# Patient Record
Sex: Male | Born: 1970 | Race: White | Hispanic: No | Marital: Single | Smoking: Never smoker
Health system: Southern US, Community
[De-identification: ages and names within clinical notes are randomized; demographics above are authoritative.]

## PROBLEM LIST (undated history)

## (undated) DIAGNOSIS — R519 Headache, unspecified: Secondary | ICD-10-CM

## (undated) DIAGNOSIS — M12551 Traumatic arthropathy, right hip: Secondary | ICD-10-CM

## (undated) DIAGNOSIS — M1990 Unspecified osteoarthritis, unspecified site: Secondary | ICD-10-CM

## (undated) DIAGNOSIS — G43109 Migraine with aura, not intractable, without status migrainosus: Secondary | ICD-10-CM

## (undated) DIAGNOSIS — Z8489 Family history of other specified conditions: Secondary | ICD-10-CM

## (undated) DIAGNOSIS — I10 Essential (primary) hypertension: Secondary | ICD-10-CM

## (undated) DIAGNOSIS — M19012 Primary osteoarthritis, left shoulder: Secondary | ICD-10-CM

## (undated) DIAGNOSIS — G9389 Other specified disorders of brain: Secondary | ICD-10-CM

## (undated) DIAGNOSIS — E291 Testicular hypofunction: Secondary | ICD-10-CM

## (undated) DIAGNOSIS — I5189 Other ill-defined heart diseases: Secondary | ICD-10-CM

## (undated) DIAGNOSIS — G459 Transient cerebral ischemic attack, unspecified: Secondary | ICD-10-CM

## (undated) DIAGNOSIS — R9089 Other abnormal findings on diagnostic imaging of central nervous system: Secondary | ICD-10-CM

## (undated) DIAGNOSIS — K611 Rectal abscess: Secondary | ICD-10-CM

## (undated) DIAGNOSIS — G4730 Sleep apnea, unspecified: Secondary | ICD-10-CM

## (undated) DIAGNOSIS — R51 Headache: Secondary | ICD-10-CM

## (undated) HISTORY — DX: Traumatic arthropathy, right hip: M12.551

## (undated) HISTORY — DX: Primary osteoarthritis, left shoulder: M19.012

## (undated) HISTORY — DX: Other specified disorders of brain: G93.89

## (undated) HISTORY — PX: HIP SURGERY: SHX245

## (undated) HISTORY — DX: Essential (primary) hypertension: I10

## (undated) HISTORY — DX: Rectal abscess: K61.1

## (undated) HISTORY — DX: Testicular hypofunction: E29.1

## (undated) HISTORY — DX: Transient cerebral ischemic attack, unspecified: G45.9

## (undated) HISTORY — DX: Migraine with aura, not intractable, without status migrainosus: G43.109

## (undated) HISTORY — DX: Other abnormal findings on diagnostic imaging of central nervous system: R90.89

---

## 2010-12-28 ENCOUNTER — Emergency Department: Payer: Self-pay | Admitting: Emergency Medicine

## 2011-06-20 ENCOUNTER — Ambulatory Visit: Payer: Self-pay | Admitting: Orthopedic Surgery

## 2011-07-01 ENCOUNTER — Ambulatory Visit: Payer: Self-pay

## 2012-11-01 DIAGNOSIS — E291 Testicular hypofunction: Secondary | ICD-10-CM | POA: Insufficient documentation

## 2012-11-01 HISTORY — DX: Testicular hypofunction: E29.1

## 2012-12-05 DIAGNOSIS — M12551 Traumatic arthropathy, right hip: Secondary | ICD-10-CM | POA: Insufficient documentation

## 2012-12-05 HISTORY — DX: Traumatic arthropathy, right hip: M12.551

## 2013-11-04 DIAGNOSIS — M19012 Primary osteoarthritis, left shoulder: Secondary | ICD-10-CM

## 2013-11-04 HISTORY — DX: Primary osteoarthritis, left shoulder: M19.012

## 2015-01-12 ENCOUNTER — Encounter: Payer: Self-pay | Admitting: Emergency Medicine

## 2015-01-12 ENCOUNTER — Emergency Department: Payer: BC Managed Care – PPO

## 2015-01-12 ENCOUNTER — Observation Stay
Admit: 2015-01-12 | Discharge: 2015-01-12 | Disposition: A | Discharge status: Home / Self Care | Payer: BC Managed Care – PPO | Attending: Internal Medicine | Admitting: Internal Medicine

## 2015-01-12 ENCOUNTER — Observation Stay
Admission: EM | Admit: 2015-01-12 | Discharge: 2015-01-13 | Disposition: A | Discharge status: Home / Self Care | Payer: BC Managed Care – PPO | Attending: Internal Medicine | Admitting: Internal Medicine

## 2015-01-12 DIAGNOSIS — R112 Nausea with vomiting, unspecified: Secondary | ICD-10-CM | POA: Diagnosis not present

## 2015-01-12 DIAGNOSIS — G459 Transient cerebral ischemic attack, unspecified: Secondary | ICD-10-CM

## 2015-01-12 DIAGNOSIS — R930 Abnormal findings on diagnostic imaging of skull and head, not elsewhere classified: Secondary | ICD-10-CM | POA: Insufficient documentation

## 2015-01-12 DIAGNOSIS — R51 Headache: Secondary | ICD-10-CM | POA: Diagnosis present

## 2015-01-12 DIAGNOSIS — I638 Other cerebral infarction: Secondary | ICD-10-CM | POA: Diagnosis present

## 2015-01-12 DIAGNOSIS — M542 Cervicalgia: Secondary | ICD-10-CM | POA: Insufficient documentation

## 2015-01-12 DIAGNOSIS — R519 Headache, unspecified: Secondary | ICD-10-CM

## 2015-01-12 DIAGNOSIS — M1991 Primary osteoarthritis, unspecified site: Secondary | ICD-10-CM | POA: Diagnosis not present

## 2015-01-12 DIAGNOSIS — Z87820 Personal history of traumatic brain injury: Secondary | ICD-10-CM | POA: Diagnosis not present

## 2015-01-12 DIAGNOSIS — R42 Dizziness and giddiness: Secondary | ICD-10-CM | POA: Insufficient documentation

## 2015-01-12 DIAGNOSIS — I639 Cerebral infarction, unspecified: Secondary | ICD-10-CM

## 2015-01-12 DIAGNOSIS — I10 Essential (primary) hypertension: Secondary | ICD-10-CM

## 2015-01-12 DIAGNOSIS — G43909 Migraine, unspecified, not intractable, without status migrainosus: Principal | ICD-10-CM | POA: Insufficient documentation

## 2015-01-12 DIAGNOSIS — G8929 Other chronic pain: Secondary | ICD-10-CM

## 2015-01-12 DIAGNOSIS — G40209 Localization-related (focal) (partial) symptomatic epilepsy and epileptic syndromes with complex partial seizures, not intractable, without status epilepticus: Secondary | ICD-10-CM | POA: Insufficient documentation

## 2015-01-12 DIAGNOSIS — Z23 Encounter for immunization: Secondary | ICD-10-CM | POA: Insufficient documentation

## 2015-01-12 DIAGNOSIS — I6389 Other cerebral infarction: Secondary | ICD-10-CM

## 2015-01-12 HISTORY — DX: Transient cerebral ischemic attack, unspecified: G45.9

## 2015-01-12 HISTORY — DX: Unspecified osteoarthritis, unspecified site: M19.90

## 2015-01-12 HISTORY — DX: Essential (primary) hypertension: I10

## 2015-01-12 HISTORY — DX: Headache, unspecified: R51.9

## 2015-01-12 HISTORY — DX: Headache: R51

## 2015-01-12 LAB — CBC WITH DIFFERENTIAL/PLATELET
Basophils Absolute: 0 10*3/uL (ref 0–0.1)
Basophils Relative: 0 %
Eosinophils Absolute: 0 10*3/uL (ref 0–0.7)
Eosinophils Relative: 1 %
HEMATOCRIT: 49.1 % (ref 40.0–52.0)
HEMOGLOBIN: 17 g/dL (ref 13.0–18.0)
LYMPHS ABS: 0.9 10*3/uL — AB (ref 1.0–3.6)
LYMPHS PCT: 12 %
MCH: 33.3 pg (ref 26.0–34.0)
MCHC: 34.7 g/dL (ref 32.0–36.0)
MCV: 96.1 fL (ref 80.0–100.0)
MONOS PCT: 9 %
Monocytes Absolute: 0.6 10*3/uL (ref 0.2–1.0)
NEUTROS ABS: 5.4 10*3/uL (ref 1.4–6.5)
NEUTROS PCT: 78 %
Platelets: 218 10*3/uL (ref 150–440)
RBC: 5.11 MIL/uL (ref 4.40–5.90)
RDW: 12.8 % (ref 11.5–14.5)
WBC: 6.9 10*3/uL (ref 3.8–10.6)

## 2015-01-12 LAB — COMPREHENSIVE METABOLIC PANEL
ALBUMIN: 4.5 g/dL (ref 3.5–5.0)
ALK PHOS: 77 U/L (ref 38–126)
ALT: 18 U/L (ref 17–63)
ANION GAP: 7 (ref 5–15)
AST: 19 U/L (ref 15–41)
BILIRUBIN TOTAL: 0.6 mg/dL (ref 0.3–1.2)
BUN: 11 mg/dL (ref 6–20)
CALCIUM: 9.1 mg/dL (ref 8.9–10.3)
CO2: 27 mmol/L (ref 22–32)
CREATININE: 0.82 mg/dL (ref 0.61–1.24)
Chloride: 99 mmol/L — ABNORMAL LOW (ref 101–111)
GFR calc Af Amer: >60 mL/min (ref >60)
GFR calc non Af Amer: >60 mL/min (ref >60)
GLUCOSE: 98 mg/dL (ref 65–99)
Potassium: 3.9 mmol/L (ref 3.5–5.1)
Sodium: 133 mmol/L — ABNORMAL LOW (ref 135–145)
TOTAL PROTEIN: 7.2 g/dL (ref 6.5–8.1)

## 2015-01-12 LAB — URINALYSIS COMPLETE WITH MICROSCOPIC (ARMC ONLY)
BILIRUBIN URINE: NEGATIVE
Bacteria, UA: NONE SEEN
GLUCOSE, UA: NEGATIVE mg/dL
HGB URINE DIPSTICK: NEGATIVE
Ketones, ur: NEGATIVE mg/dL
LEUKOCYTES UA: NEGATIVE
Nitrite: NEGATIVE
Protein, ur: NEGATIVE mg/dL
SQUAMOUS EPITHELIAL / LPF: NONE SEEN
Specific Gravity, Urine: 1.015 (ref 1.005–1.030)
pH: 6 (ref 5.0–8.0)

## 2015-01-12 LAB — CBC
HCT: 47.8 % (ref 40.0–52.0)
Hemoglobin: 16.7 g/dL (ref 13.0–18.0)
MCH: 33.6 pg (ref 26.0–34.0)
MCHC: 34.8 g/dL (ref 32.0–36.0)
MCV: 96.3 fL (ref 80.0–100.0)
PLATELETS: 232 10*3/uL (ref 150–440)
RBC: 4.97 MIL/uL (ref 4.40–5.90)
RDW: 12.7 % (ref 11.5–14.5)
WBC: 6.8 10*3/uL (ref 3.8–10.6)

## 2015-01-12 LAB — CREATININE, SERUM
Creatinine, Ser: 0.79 mg/dL (ref 0.61–1.24)
GFR calc Af Amer: >60 mL/min (ref >60)
GFR calc non Af Amer: >60 mL/min (ref >60)

## 2015-01-12 LAB — LIPID PANEL
CHOL/HDL RATIO: 2.8 ratio
Cholesterol: 154 mg/dL (ref 0–200)
HDL: 55 mg/dL (ref >40)
LDL CALC: 79 mg/dL (ref 0–99)
Triglycerides: 98 mg/dL (ref <150)
VLDL: 20 mg/dL (ref 0–40)

## 2015-01-12 MED ORDER — HYDRALAZINE HCL 20 MG/ML IJ SOLN: 10.0000 mg | Freq: Four times a day (QID) | INTRAMUSCULAR | Status: DC | PRN

## 2015-01-12 MED ORDER — HEPARIN SODIUM (PORCINE) 5000 UNIT/ML IJ SOLN
5000.0000 [IU] | Freq: Three times a day (TID) | INTRAMUSCULAR | Status: DC
  Administered 2015-01-12 – 2015-01-13 (×2): 5000 [IU] via SUBCUTANEOUS
  Filled 2015-01-12 (×2): qty 1

## 2015-01-12 MED ORDER — BUTALBITAL-APAP-CAFFEINE 50-325-40 MG PO TABS: 1.0000 | ORAL_TABLET | Freq: Four times a day (QID) | ORAL | Status: DC | PRN

## 2015-01-12 MED ORDER — ASPIRIN 325 MG PO TABS
325.0000 mg | ORAL_TABLET | Freq: Every day | ORAL | Status: DC
  Administered 2015-01-12 – 2015-01-13 (×2): 325 mg via ORAL
  Filled 2015-01-12 (×2): qty 1

## 2015-01-12 MED ORDER — INFLUENZA VAC SPLIT QUAD 0.5 ML IM SUSY
0.5000 mL | PREFILLED_SYRINGE | INTRAMUSCULAR | Status: AC
  Administered 2015-01-13: 13:00:00 0.5 mL via INTRAMUSCULAR
  Filled 2015-01-12: qty 0.5

## 2015-01-12 MED ORDER — CELECOXIB 400 MG PO CAPS
400.0000 mg | ORAL_CAPSULE | Freq: Every day | ORAL | Status: DC
  Administered 2015-01-13: 10:00:00 400 mg via ORAL
  Filled 2015-01-12 (×3): qty 1

## 2015-01-12 MED ORDER — BUTALBITAL-APAP-CAFFEINE 50-325-40 MG PO TABS
1.0000 | ORAL_TABLET | ORAL | Status: DC | PRN
  Administered 2015-01-12: 1 via ORAL
  Filled 2015-01-12: qty 1

## 2015-01-12 MED ORDER — GADOBENATE DIMEGLUMINE 529 MG/ML IV SOLN
20.0000 mL | Freq: Once | INTRAVENOUS | Status: AC | PRN
  Administered 2015-01-12: 20 mL via INTRAVENOUS
  Filled 2015-01-12: qty 20

## 2015-01-12 MED ORDER — ATORVASTATIN CALCIUM 20 MG PO TABS
40.0000 mg | ORAL_TABLET | Freq: Every day | ORAL | Status: DC
  Administered 2015-01-12: 19:00:00 40 mg via ORAL
  Filled 2015-01-12: qty 2

## 2015-01-12 MED ORDER — SUMATRIPTAN SUCCINATE 6 MG/0.5ML ~~LOC~~ SOLN
6.0000 mg | Freq: Once | SUBCUTANEOUS | Status: AC
  Administered 2015-01-12: 6 mg via SUBCUTANEOUS
  Filled 2015-01-12: qty 0.5

## 2015-01-12 NOTE — Progress Notes (Signed)
*  PRELIMINARY RESULTS* Echocardiogram 2D Echocardiogram has been performed.  Christopher Chen 01/12/2015, 8:17 PM

## 2015-01-12 NOTE — ED Notes (Signed)
C/o ha past several days, with some visual problems

## 2015-01-12 NOTE — ED Provider Notes (Signed)
[facility] Emergency Department Provider Note  ____________________________________________  Time seen: Approximately 11:43 AM  I have reviewed the triage vital signs and the nursing notes.   HISTORY  Chief Complaint Headache   HPI Christopher Chen is a 44 y.o. male who presents for evaluation of severe headache 4 days sudden onset was Thursday of last week with blurred vision out of the right eye. Patient states that his still having decreased vision out of the right eye. Denies any numbness tingling or slurring of speech. Past medical history significant for headaches since 2001 but nothing as bad as the one 4 days ago. Vital signs noted with continued elevation of blood pressure.   Past Medical History  Diagnosis Date  . Generalized headaches   . Arthritis     Patient Active Problem List   Diagnosis Date Noted  . TIA (transient ischemic attack) 01/12/2015  . Hypertension 01/12/2015    History reviewed. No pertinent past surgical history.  Current Outpatient Rx  Name  Route  Sig  Dispense  Refill  . celecoxib (CELEBREX) 400 MG capsule   Oral   Take 400 mg by mouth daily.           Allergies Morphine and related  No family history on file.  Social History Social History  Substance Use Topics  . Smoking status: Never Smoker   . Smokeless tobacco: None  . Alcohol Use: No    Review of Systems Constitutional: No fever/chills Eyes: Positive visual changes to the right eye ENT: No sore throat. Cardiovascular: Denies chest pain. Respiratory: Denies shortness of breath. Gastrointestinal: No abdominal pain.  No nausea, no vomiting.  No diarrhea.  No constipation. Genitourinary: Negative for dysuria. Musculoskeletal: Negative for back pain. Skin: Negative for rash. Neurological: Positive for headaches, negative for focal weakness or numbness.  10-point ROS otherwise negative.  ____________________________________________   PHYSICAL  EXAM:  VITAL SIGNS: ED Triage Vitals  Enc Vitals Group     BP 01/12/15 1044 163/96 mmHg     Pulse Rate 01/12/15 1044 93     Resp 01/12/15 1044 18     Temp 01/12/15 1044 97.6 F (36.4 C)     Temp Source 01/12/15 1044 Oral     SpO2 01/12/15 1044 97 %     Weight 01/12/15 1044 235 lb (106.595 kg)     Height 01/12/15 1044 6\' 3"  (1.905 m)     Head Cir --      Peak Flow --      Pain Score 01/12/15 1045 8     Pain Loc --      Pain Edu? --      Excl. in [place]? --     Constitutional: Alert and oriented. Well appearing and in no acute distress. Eyes: Conjunctivae are normal. PERRL. EOMI. Head: Atraumatic. Nose: No congestion/rhinnorhea. Mouth/Throat: Mucous membranes are moist.  Oropharynx non-erythematous. Neck: No stridor.  No cervical spinal tenderness to palpation however paraspinal tenderness is palpable Cardiovascular: Normal rate, regular rhythm. Grossly normal heart sounds.  Good peripheral circulation. Respiratory: Normal respiratory effort.  No retractions. Lungs CTAB. Gastrointestinal: Soft and nontender. No distention. No abdominal bruits. No CVA tenderness. Musculoskeletal: No lower extremity tenderness nor edema.  No joint effusions. Neurologic:  Normal speech and language. No gross focal neurologic deficits are appreciated. No gait instability. Cranial nerves II through XII grossly intact. Skin:  Skin is warm, dry and intact. No rash noted. Psychiatric: Mood and affect are normal. Speech and behavior are normal.  ____________________________________________   LABS (all labs ordered are listed, but only abnormal results are displayed)  Labs Reviewed  COMPREHENSIVE METABOLIC PANEL - Abnormal; Notable for the following:    Sodium 133 (*)    Chloride 99 (*)    All other components within normal limits  URINALYSIS COMPLETEWITH MICROSCOPIC ([facility] ONLY) - Abnormal; Notable for the following:    Color, Urine YELLOW (*)    APPearance CLEAR (*)    All other components within  normal limits  CBC WITH DIFFERENTIAL/PLATELET - Abnormal; Notable for the following:    Lymphs Abs 0.9 (*)    All other components within normal limits  HEMOGLOBIN A1C  LIPID PANEL   ____________________________________________  EKG   ____________________________________________  RADIOLOGY  IMPRESSION: CT head: Small area of decreased attenuation at the gray -white junction of the anterior right parietal lobe, best seen on axial slice 19 series 2, concerning for small recent infarct. Gray-white compartments otherwise appear normal. No hemorrhage or mass effect.  CT cervical spine: No fracture or spondylolisthesis. No appreciable arthropathy. No disc extrusion or stenosis apparent. ____________________________________________   PROCEDURES  Procedure(s) performed: None  Critical Care performed: No  ____________________________________________   INITIAL IMPRESSION / ASSESSMENT AND PLAN / ED COURSE  Pertinent labs & imaging results that were available during my care of the patient were reviewed by me and considered in my medical decision making (see chart for details).  Discussed all clinical findings with the attending neurologist Dr. Tamala Julian. We'll admit through hospitalist with consultation. Diagnosis acute acute parietal infarct. ____________________________________________   FINAL CLINICAL IMPRESSION(S) / ED DIAGNOSES  Final diagnoses:  Chronic intractable headache, unspecified headache type  Parietal lobe infarction [facility])      Arlyss Repress, PA-C 01/12/15 [id number]  Lavonia Drafts, MD 01/13/15 1054

## 2015-01-12 NOTE — ED Notes (Signed)
Headache x 4 days, no head injury, history of similar headaches with blur vision also previous.

## 2015-01-12 NOTE — ED Notes (Signed)
Returned from mri 

## 2015-01-12 NOTE — ED Notes (Signed)
Several days ago has had several episodes of snowfl;ake flakes in visual fieldwould have a headache  2 days had worse headache with vomiting and diarrhe, Saturday only had 2 episodes of snowflakes in visual field view, today we woke up had blurrd vision in rioght eye, then then developed headache and nausea, , headache has been there all day today

## 2015-01-12 NOTE — ED Notes (Signed)
ADM MD WITH PT

## 2015-01-12 NOTE — ED Provider Notes (Addendum)
Patient with abrupt onset headache 4 days ago with subsequent decreased vision in the right eye. No other neuro deficits. CT concerning for CVA. EKG unremarkable, vitals shows mild hypertension. We will admit the patient for further workup  ED ECG REPORT I, Lavonia Drafts, the attending physician, personally viewed and interpreted this ECG.  Date: 01/12/2015 EKG Time: 1:55 PM Rate: 83 Rhythm: normal sinus rhythm QRS Axis: normal Intervals: normal ST/T Wave abnormalities: normal Conduction Disutrbances: none Narrative Interpretation: unremarkable    Lavonia Drafts, MD 01/12/15 1411  Lavonia Drafts, MD 01/12/15 [phone]

## 2015-01-12 NOTE — ED Notes (Signed)
Assessment per PA 

## 2015-01-12 NOTE — Progress Notes (Signed)
Headache for years  CT with subtle R parietal change  Probable mass due to no focal deficits but could be stroke or even epileptic -  EEG -  MRI of brain w/wo contrast -  PRN Fioricet -  Place on stroke protocol -  Full note to follow

## 2015-01-12 NOTE — ED Notes (Signed)
Patient transported to CT 

## 2015-01-12 NOTE — H&P (Signed)
[name]    MR#:  [id number]  DATE OF BIRTH:  [birth year]  DATE OF ADMISSION:  01/12/2015  PRIMARY CARE PHYSICIAN: Chen, Christopher Noa, MD   REQUESTING/REFERRING PHYSICIAN: Mcshane  CHIEF COMPLAINT:   Chief Complaint  Patient presents with  . Headache    HISTORY OF PRESENT ILLNESS: Christopher Chen  is a 43 y.o. male with a known history of arthritis following an accident, but otherwise very healthy and go for yearly physical check. Since Thursday evening which is 4 days ago every afternoon around 4 or 5:00 he have repeated symptoms of blurry vision in the right eye, severe headache on the right side, feeling dizziness. On Friday evening he also had excessive feeling of dizziness and vomiting. But the symptoms subside within few hours and he is able to carry over his function the next day so he waited for 4 days but as it kept coming back up finally today he came to emergency room. In ER initial CT scan of the head was done which showed possible stroke and so he is given his admission after doing neurology consult  PAST MEDICAL HISTORY:   Past Medical History  Diagnosis Date  . Generalized headaches   . Arthritis     PAST SURGICAL HISTORY: History reviewed. No pertinent past surgical history.  SOCIAL HISTORY:  Social History  Substance Use Topics  . Smoking status: Never Smoker   . Smokeless tobacco: Not on file  . Alcohol Use: No    FAMILY HISTORY: No family history on file.  DRUG ALLERGIES:  Allergies  Allergen Reactions  . Morphine And Related Itching    REVIEW OF SYSTEMS:   CONSTITUTIONAL: No fever, fatigue or weakness. Positive for headache. EYES: Positive for blurred or double vision.  EARS, NOSE, AND THROAT: No tinnitus or ear pain.  RESPIRATORY: No cough, shortness of breath, wheezing or hemoptysis.  CARDIOVASCULAR: No chest pain, orthopnea, edema.  GASTROINTESTINAL: No nausea,  vomiting, diarrhea or abdominal pain.  GENITOURINARY: No dysuria, hematuria.  ENDOCRINE: No polyuria, nocturia,  HEMATOLOGY: No anemia, easy bruising or bleeding SKIN: No rash or lesion. MUSCULOSKELETAL: No joint pain or arthritis.   NEUROLOGIC: No tingling, numbness, weakness.  PSYCHIATRY: No anxiety or depression.   MEDICATIONS AT HOME:  Prior to Admission medications   Medication Sig Start Date End Date Taking? Authorizing Provider  celecoxib (CELEBREX) 400 MG capsule Take 400 mg by mouth daily.   Yes Historical Provider, MD      PHYSICAL EXAMINATION:   VITAL SIGNS: Blood pressure 171/104, pulse 96, temperature 98.2 F (36.8 C), temperature source Oral, resp. rate 17, height 6\' 3"  (1.905 m), weight 106.595 kg (235 lb), SpO2 96 %.  GENERAL:  44 y.o.-year-old patient lying in the bed with no acute distress.  EYES: Pupils equal, round, reactive to light and accommodation. No scleral icterus. Extraocular muscles intact.  HEENT: Head atraumatic, normocephalic. Oropharynx and nasopharynx clear.  NECK:  Supple, no jugular venous distention. No thyroid enlargement, no tenderness.  LUNGS: Normal breath sounds bilaterally, no wheezing, rales,rhonchi or crepitation. No use of accessory muscles of respiration.  CARDIOVASCULAR: S1, S2 normal. No murmurs, rubs, or gallops.  ABDOMEN: Soft, nontender, nondistended. Bowel sounds present. No organomegaly or mass.  EXTREMITIES: No pedal edema, cyanosis, or clubbing.  NEUROLOGIC: Cranial nerves II through XII are intact. Muscle strength 5/5 in all extremities. Sensation intact. Gait not checked.  PSYCHIATRIC: The patient is alert and oriented  x 3.  SKIN: No obvious rash, lesion, or ulcer.   LABORATORY PANEL:   CBC  Recent Labs Lab 01/12/15 1326  WBC 6.9  HGB 17.0  HCT 49.1  PLT 218  MCV 96.1  MCH 33.3  MCHC 34.7  RDW 12.8  LYMPHSABS 0.9*  MONOABS 0.6  EOSABS 0.0  BASOSABS 0.0    ------------------------------------------------------------------------------------------------------------------  Chemistries   Recent Labs Lab 01/12/15 1326  NA 133*  K 3.9  CL 99*  CO2 27  GLUCOSE 98  BUN 11  CREATININE 0.82  CALCIUM 9.1  AST 19  ALT 18  ALKPHOS 77  BILITOT 0.6   ------------------------------------------------------------------------------------------------------------------ estimated creatinine clearance is 151.7 mL/min (by C-G formula based on Cr of 0.82). ------------------------------------------------------------------------------------------------------------------ No results for input(s): TSH, T4TOTAL, T3FREE, THYROIDAB in the last 72 hours.  Invalid input(s): FREET3   Coagulation profile No results for input(s): INR, PROTIME in the last 168 hours. ------------------------------------------------------------------------------------------------------------------- No results for input(s): DDIMER in the last 72 hours. -------------------------------------------------------------------------------------------------------------------  Cardiac Enzymes No results for input(s): CKMB, TROPONINI, MYOGLOBIN in the last 168 hours.  Invalid input(s): CK ------------------------------------------------------------------------------------------------------------------ Invalid input(s): POCBNP  ---------------------------------------------------------------------------------------------------------------  Urinalysis    Component Value Date/Time   COLORURINE YELLOW* 01/12/2015 1325   APPEARANCEUR CLEAR* 01/12/2015 1325   LABSPEC 1.015 01/12/2015 1325   PHURINE 6.0 01/12/2015 1325   GLUCOSEU NEGATIVE 01/12/2015 1325   HGBUR NEGATIVE 01/12/2015 1325   BILIRUBINUR NEGATIVE 01/12/2015 1325   KETONESUR NEGATIVE 01/12/2015 1325   PROTEINUR NEGATIVE 01/12/2015 1325   NITRITE NEGATIVE 01/12/2015 1325   LEUKOCYTESUR NEGATIVE 01/12/2015 1325      RADIOLOGY: Ct Head Wo Contrast  01/12/2015  CLINICAL DATA:  Four day history of headache and left-sided neck pain. Recent blurred vision EXAM: CT HEAD WITHOUT CONTRAST CT CERVICAL SPINE WITHOUT CONTRAST TECHNIQUE: Multidetector CT imaging of the head and cervical spine was performed following the standard protocol without intravenous contrast. Multiplanar CT image reconstructions of the cervical spine were also generated. COMPARISON:  None. FINDINGS: CT HEAD FINDINGS The ventricles are normal in size and configuration. The right lateral ventricle is slightly larger than the left lateral ventricle, an anatomic variant. There is no mass, hemorrhage, extra-axial fluid collection, or midline shift. There is a small area of decreased attenuation at the gray - white junction of the anterior right parietal lobe, concerning for a small infarct in this area. Elsewhere gray-white compartments appear normal. Bony calvarium appears intact. The mastoid air cells are clear. No intraorbital lesions are identified. CT CERVICAL SPINE FINDINGS There is no fracture or spondylolisthesis. Prevertebral soft tissues and predental space regions are normal. Disc spaces appear intact. There is no nerve root edema or effacement appreciable on this noncontrast enhanced study. No spinal stenosis evident. A small focus of calcification is noted in the nuchal ligament posterior to C6. IMPRESSION: CT head: Small area of decreased attenuation at the gray -white junction of the anterior right parietal lobe, best seen on axial slice 19 series 2, concerning for small recent infarct. Gray-white compartments otherwise appear normal. No hemorrhage or mass effect. CT cervical spine: No fracture or spondylolisthesis. No appreciable arthropathy. No disc extrusion or stenosis apparent. Electronically Signed   By: Lowella Grip III M.D.   On: 01/12/2015 12:39   Ct Cervical Spine Wo Contrast  01/12/2015  CLINICAL DATA:  Four day history of  headache and left-sided neck pain. Recent blurred vision EXAM: CT HEAD WITHOUT CONTRAST CT CERVICAL SPINE WITHOUT CONTRAST TECHNIQUE: Multidetector CT imaging of the head and cervical spine  was performed following the standard protocol without intravenous contrast. Multiplanar CT image reconstructions of the cervical spine were also generated. COMPARISON:  None. FINDINGS: CT HEAD FINDINGS The ventricles are normal in size and configuration. The right lateral ventricle is slightly larger than the left lateral ventricle, an anatomic variant. There is no mass, hemorrhage, extra-axial fluid collection, or midline shift. There is a small area of decreased attenuation at the gray - white junction of the anterior right parietal lobe, concerning for a small infarct in this area. Elsewhere gray-white compartments appear normal. Bony calvarium appears intact. The mastoid air cells are clear. No intraorbital lesions are identified. CT CERVICAL SPINE FINDINGS There is no fracture or spondylolisthesis. Prevertebral soft tissues and predental space regions are normal. Disc spaces appear intact. There is no nerve root edema or effacement appreciable on this noncontrast enhanced study. No spinal stenosis evident. A small focus of calcification is noted in the nuchal ligament posterior to C6. IMPRESSION: CT head: Small area of decreased attenuation at the gray -white junction of the anterior right parietal lobe, best seen on axial slice 19 series 2, concerning for small recent infarct. Gray-white compartments otherwise appear normal. No hemorrhage or mass effect. CT cervical spine: No fracture or spondylolisthesis. No appreciable arthropathy. No disc extrusion or stenosis apparent. Electronically Signed   By: Lowella Grip III M.D.   On: 01/12/2015 12:39   Mr Jeri Cos [id number] Contrast  01/12/2015  CLINICAL DATA:  Headache with blurred vision for 4 days. EXAM: MRI HEAD WITHOUT AND WITH CONTRAST TECHNIQUE: Multiplanar, multiecho  pulse sequences of the brain and surrounding structures were obtained without and with intravenous contrast. CONTRAST:  10mL MULTIHANCE GADOBENATE DIMEGLUMINE 529 MG/ML IV SOLN COMPARISON:  Head CT 01/12/2015 FINDINGS: There is no evidence of acute infarct, mass, midline shift, or extra-axial fluid collection. There is a small region of transcortical encephalomalacia in the right parietal lobe corresponding to the abnormality on CT. There is a small amount of associated chronic blood products or mineralization. The brain is otherwise normal in appearance. No abnormal enhancement is identified. There is no hydrocephalus. Orbits are unremarkable. Paranasal sinuses and mastoid air cells are clear. Major intracranial vascular flow voids are preserved. IMPRESSION: 1. No acute intracranial abnormality. 2. 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 Electronically Signed   By: Logan Bores M.D.   On: 01/12/2015 15:19    IMPRESSION AND PLAN:  * TIA  His Spiriva because of TIA or he might be having a comminuted migraine.  We'll do stroke workup MRI, echocardiogram, carotid Doppler study. Check lipid panel and give aspirin.  Seen by Neurologist.  We will give her Fioricet to help with migraine.   All the records are reviewed and case discussed with ED provider. Management plans discussed with the patient, family and they are in agreement.  CODE STATUS: Full code    TOTAL TIME TAKING CARE OF THIS PATIENT: 50 minutes.    Vaughan Basta M.D on 01/12/2015   Between 7am to 6pm - Pager - [phone]  After 6pm go to [email or web address] - password [email or web address] [facility]  Office  [phone]  CC: Primary care physician; [facility], Christopher Noa, MD   Note: This dictation was prepared with Dragon dictation along with smaller phrase technology. Any transcriptional errors that result from this process are unintentional.

## 2015-01-13 ENCOUNTER — Observation Stay: Payer: BC Managed Care – PPO

## 2015-01-13 LAB — CBC
HCT: 45.2 % (ref 40.0–52.0)
HEMOGLOBIN: 16 g/dL (ref 13.0–18.0)
MCH: 34.4 pg — AB (ref 26.0–34.0)
MCHC: 35.4 g/dL (ref 32.0–36.0)
MCV: 97 fL (ref 80.0–100.0)
PLATELETS: 205 10*3/uL (ref 150–440)
RBC: 4.65 MIL/uL (ref 4.40–5.90)
RDW: 13.3 % (ref 11.5–14.5)
WBC: 4.7 10*3/uL (ref 3.8–10.6)

## 2015-01-13 LAB — BASIC METABOLIC PANEL
ANION GAP: 5 (ref 5–15)
BUN: 13 mg/dL (ref 6–20)
CALCIUM: 8.5 mg/dL — AB (ref 8.9–10.3)
CO2: 28 mmol/L (ref 22–32)
CREATININE: 0.82 mg/dL (ref 0.61–1.24)
Chloride: 100 mmol/L — ABNORMAL LOW (ref 101–111)
GFR calc Af Amer: >60 mL/min (ref >60)
GLUCOSE: 95 mg/dL (ref 65–99)
Potassium: 3.7 mmol/L (ref 3.5–5.1)
Sodium: 133 mmol/L — ABNORMAL LOW (ref 135–145)

## 2015-01-13 LAB — HEMOGLOBIN A1C: HEMOGLOBIN A1C: 4.9 % (ref 4.0–6.0)

## 2015-01-13 MED ORDER — BUTALBITAL-APAP-CAFFEINE 50-325-40 MG PO TABS: 1.0000 | ORAL_TABLET | ORAL | Status: DC | PRN

## 2015-01-13 MED ORDER — ASPIRIN EC 81 MG PO TBEC: 81.0000 mg | DELAYED_RELEASE_TABLET | Freq: Every day | ORAL | Status: DC

## 2015-01-13 MED ORDER — OXCARBAZEPINE 300 MG PO TABS: 300.0000 mg | ORAL_TABLET | Freq: Two times a day (BID) | ORAL | Status: DC

## 2015-01-13 MED ORDER — OXCARBAZEPINE 150 MG PO TABS: 150.0000 mg | ORAL_TABLET | Freq: Two times a day (BID) | ORAL | Status: DC

## 2015-01-13 MED ORDER — OXCARBAZEPINE 150 MG PO TABS
150.0000 mg | ORAL_TABLET | Freq: Two times a day (BID) | ORAL | Status: DC
  Administered 2015-01-13: 14:00:00 150 mg via ORAL
  Filled 2015-01-13 (×2): qty 1

## 2015-01-13 NOTE — Plan of Care (Signed)
Problem: Education: Goal: Knowledge of [place] [facility] Education information/materials will improve Outcome: Progressing Pt likes to be called Christopher Chen    PAST MEDICAL HISTORY:    Past Medical History   Diagnosis  Date   .  Generalized headaches     .  Arthritis                 Problem: Coping: Goal: Ability to verbalize positive feelings about self will improve Outcome: Progressing Pt alert and oriented. No new neuro deficits noted during this shift. Up to the bathroom without complications. Continue to monitor

## 2015-01-13 NOTE — Discharge Instructions (Signed)
Diet-low salt Activity as tolerated, instructed not to drive until seen and evaluated by neurology in 8 weeks Follow-up with primary care physician in a week Follow-up with neurology in 8 weeks
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
## 2015-01-13 NOTE — Discharge Summary (Signed)
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 Generalized headaches   . 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 Melrose Nakayama in 6-8 weeks - Pt needs to keep an event log - Ok to d/c from neuro standpoint  DISCHARGE CONDITIONS:   fair  CONSULTS OBTAINED:      PROCEDURES none  DRUG ALLERGIES:   Allergies  Allergen Reactions  . Codeine Nausea And Vomiting  . Morphine And Related Itching    DISCHARGE MEDICATIONS:   Current Discharge Medication List    START taking these medications   Details  aspirin EC 81 MG tablet Take 1 tablet (81 mg total) by mouth daily. Qty: 30 tablet, Refills: 0    !! OXcarbazepine (TRILEPTAL) 150 MG tablet Take 1 tablet (150 mg total) by mouth 2 (two) times daily. Qty: 14 tablet, Refills: 0    !! Oxcarbazepine (TRILEPTAL) 300 MG tablet Take 1 tablet (300 mg total) by mouth 2 (two) times daily. Qty: 100 tablet, Refills: 0     !! - Potential duplicate medications found. Please discuss with provider.    CONTINUE these medications which have  NOT CHANGED   Details  celecoxib (CELEBREX) 400 MG capsule Take 400 mg by mouth daily.         DISCHARGE INSTRUCTIONS:   Diet-low salt Activity as tolerated, instructed not to drive until seen and evaluated by neurology in 8 weeks Follow-up with primary care physician in a week Follow-up with neurology in 8 weeks   DIET:  Low salt   DISCHARGE CONDITION:  fair  ACTIVITY:  As tolerated , instructed not to drive until seen by neurologist  OXYGEN:  Home Oxygen: no   Oxygen Delivery - none  DISCHARGE LOCATION:  Home    If you experience worsening of your admission symptoms, develop shortness of breath, life threatening emergency, suicidal or homicidal thoughts you must seek medical attention immediately by calling 911 or calling your MD immediately  if symptoms less severe.  You Must read complete instructions/literature along with all the possible adverse reactions/side effects for all the Medicines you take and that have been prescribed to you. Take any new Medicines after you have completely understood and accpet all the possible adverse reactions/side effects.   Please note  You were cared for by a hospitalist during your hospital stay. If you have any questions about your discharge medications or the care you received while you were in the hospital after you are discharged, you can call the unit and asked to speak with the hospitalist on call if the hospitalist that took care of  you is not available. Once you are discharged, your primary care physician will handle any further medical issues. Please note that NO REFILLS for any discharge medications will be authorized once you are discharged, as it is imperative that you return to your primary care physician (or establish a relationship with a primary care physician if you do not have one) for your aftercare needs so that they can reassess your need for medications and monitor your lab values.     Today  Chief Complaint   Patient presents with  . Headache   pts ha is resolved. No other compaints, no ON events per staff  ROS:  CONSTITUTIONAL: Denies fevers, chills. Denies any fatigue, weakness.  EYES: Denies blurry vision, double vision, eye pain. EARS, NOSE, THROAT: Denies tinnitus, ear pain, hearing loss. RESPIRATORY: Denies cough, wheeze, shortness of breath.  CARDIOVASCULAR: Denies chest pain, palpitations, edema.  GASTROINTESTINAL: Denies nausea, vomiting, diarrhea, abdominal pain. Denies bright red blood per rectum. GENITOURINARY: Denies dysuria, hematuria. ENDOCRINE: Denies nocturia or thyroid problems. HEMATOLOGIC AND LYMPHATIC: Denies easy bruising or bleeding. SKIN: Denies rash or lesion. MUSCULOSKELETAL: Denies pain in neck, back, shoulder, knees, hips or arthritic symptoms.  NEUROLOGIC: Denies paralysis, paresthesias.  PSYCHIATRIC: Denies anxiety or depressive symptoms.   VITAL SIGNS:  Blood pressure 127/78, pulse 79, temperature 98 F (36.7 C), temperature source Oral, resp. rate 16, height 6\' 3"  (1.905 m), weight 108.636 kg (239 lb 8 oz), SpO2 98 %.  I/O:   Intake/Output Summary (Last 24 hours) at 01/13/15 1558 Last data filed at 01/13/15 1200  Gross per 24 hour  Intake    480 ml  Output      0 ml  Net    480 ml    PHYSICAL EXAMINATION:  GENERAL:  44 y.o.-year-old patient lying in the bed with no acute distress.  EYES: Pupils equal, round, reactive to light and accommodation. No scleral icterus. Extraocular muscles intact.  HEENT: Head atraumatic, normocephalic. Oropharynx and nasopharynx clear.  NECK:  Supple, no jugular venous distention. No thyroid enlargement, no tenderness.  LUNGS: Normal breath sounds bilaterally, no wheezing, rales,rhonchi or crepitation. No use of accessory muscles of respiration.  CARDIOVASCULAR: S1, S2 normal. No murmurs, rubs, or gallops.  ABDOMEN: Soft, non-tender, non-distended. Bowel sounds present. No organomegaly or mass.  EXTREMITIES: No pedal  edema, cyanosis, or clubbing.  NEUROLOGIC: Cranial nerves II through XII are intact. Muscle strength 5/5 in all extremities. Sensation intact. Gait not checked.  PSYCHIATRIC: The patient is alert and oriented x 3.  SKIN: No obvious rash, lesion, or ulcer.   DATA REVIEW:   CBC  Recent Labs Lab 01/13/15 0605  WBC 4.7  HGB 16.0  HCT 45.2  PLT 205    Chemistries   Recent Labs Lab 01/12/15 1326  01/13/15 0605  NA 133*  --  133*  K 3.9  --  3.7  CL 99*  --  100*  CO2 27  --  28  GLUCOSE 98  --  95  BUN 11  --  13  CREATININE 0.82  < > 0.82  CALCIUM 9.1  --  8.5*  AST 19  --   --   ALT 18  --   --   ALKPHOS 77  --   --   BILITOT 0.6  --   --   < > = values in this interval not displayed.  Cardiac Enzymes No results for input(s): TROPONINI in the last 168 hours.  Microbiology Results  No results found  for this or any previous visit.  RADIOLOGY:  Ct Head Wo Contrast  01/12/2015  CLINICAL DATA:  Four day history of headache and left-sided neck pain. Recent blurred vision EXAM: CT HEAD WITHOUT CONTRAST CT CERVICAL SPINE WITHOUT CONTRAST TECHNIQUE: Multidetector CT imaging of the head and cervical spine was performed following the standard protocol without intravenous contrast. Multiplanar CT image reconstructions of the cervical spine were also generated. COMPARISON:  None. FINDINGS: CT HEAD FINDINGS The ventricles are normal in size and configuration. The right lateral ventricle is slightly larger than the left lateral ventricle, an anatomic variant. There is no mass, hemorrhage, extra-axial fluid collection, or midline shift. There is a small area of decreased attenuation at the gray - white junction of the anterior right parietal lobe, concerning for a small infarct in this area. Elsewhere gray-white compartments appear normal. Bony calvarium appears intact. The mastoid air cells are clear. No intraorbital lesions are identified. CT CERVICAL SPINE FINDINGS There is no fracture  or spondylolisthesis. Prevertebral soft tissues and predental space regions are normal. Disc spaces appear intact. There is no nerve root edema or effacement appreciable on this noncontrast enhanced study. No spinal stenosis evident. A small focus of calcification is noted in the nuchal ligament posterior to C6. IMPRESSION: CT head: Small area of decreased attenuation at the gray -white junction of the anterior right parietal lobe, best seen on axial slice 19 series 2, concerning for small recent infarct. Gray-white compartments otherwise appear normal. No hemorrhage or mass effect. CT cervical spine: No fracture or spondylolisthesis. No appreciable arthropathy. No disc extrusion or stenosis apparent. Electronically Signed   By: Lowella Grip III M.D.   On: 01/12/2015 12:39   Ct Cervical Spine Wo Contrast  01/12/2015  CLINICAL DATA:  Four day history of headache and left-sided neck pain. Recent blurred vision EXAM: CT HEAD WITHOUT CONTRAST CT CERVICAL SPINE WITHOUT CONTRAST TECHNIQUE: Multidetector CT imaging of the head and cervical spine was performed following the standard protocol without intravenous contrast. Multiplanar CT image reconstructions of the cervical spine were also generated. COMPARISON:  None. FINDINGS: CT HEAD FINDINGS The ventricles are normal in size and configuration. The right lateral ventricle is slightly larger than the left lateral ventricle, an anatomic variant. There is no mass, hemorrhage, extra-axial fluid collection, or midline shift. There is a small area of decreased attenuation at the gray - white junction of the anterior right parietal lobe, concerning for a small infarct in this area. Elsewhere gray-white compartments appear normal. Bony calvarium appears intact. The mastoid air cells are clear. No intraorbital lesions are identified. CT CERVICAL SPINE FINDINGS There is no fracture or spondylolisthesis. Prevertebral soft tissues and predental space regions are normal. Disc  spaces appear intact. There is no nerve root edema or effacement appreciable on this noncontrast enhanced study. No spinal stenosis evident. A small focus of calcification is noted in the nuchal ligament posterior to C6. IMPRESSION: CT head: Small area of decreased attenuation at the gray -white junction of the anterior right parietal lobe, best seen on axial slice 19 series 2, concerning for small recent infarct. Gray-white compartments otherwise appear normal. No hemorrhage or mass effect. CT cervical spine: No fracture or spondylolisthesis. No appreciable arthropathy. No disc extrusion or stenosis apparent. Electronically Signed   By: Lowella Grip III M.D.   On: 01/12/2015 12:39   Mr Jeri Cos [id number] Contrast  01/12/2015  CLINICAL DATA:  Headache with blurred vision for 4 days. EXAM: MRI HEAD WITHOUT AND WITH CONTRAST TECHNIQUE:  Multiplanar, multiecho pulse sequences of the brain and surrounding structures were obtained without and with intravenous contrast. CONTRAST:  80mL MULTIHANCE GADOBENATE DIMEGLUMINE 529 MG/ML IV SOLN COMPARISON:  Head CT 01/12/2015 FINDINGS: There is no evidence of acute infarct, mass, midline shift, or extra-axial fluid collection. There is a small region of transcortical encephalomalacia in the right parietal lobe corresponding to the abnormality on CT. There is a small amount of associated chronic blood products or mineralization. The brain is otherwise normal in appearance. No abnormal enhancement is identified. There is no hydrocephalus. Orbits are unremarkable. Paranasal sinuses and mastoid air cells are clear. Major intracranial vascular flow voids are preserved. IMPRESSION: 1. No acute intracranial abnormality. 2. 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 Electronically Signed   By: Logan Bores M.D.   On: 01/12/2015 15:19   US Carotid Bilateral  01/13/2015  CLINICAL DATA:  CVA. EXAM: BILATERAL CAROTID DUPLEX ULTRASOUND TECHNIQUE: Pearline Cables scale  imaging, color Doppler and duplex ultrasound were performed of bilateral carotid and vertebral arteries in the neck. COMPARISON:  MRI 01/12/2015. FINDINGS: Criteria: Quantification of carotid stenosis is based on velocity parameters that correlate the residual internal carotid diameter with NASCET-based stenosis levels, using the diameter of the distal internal carotid lumen as the denominator for stenosis measurement. The following velocity measurements were obtained: RIGHT ICA:  73/31 cm/sec CCA:  [id number] cm/sec SYSTOLIC ICA/CCA RATIO:  0.7 DIASTOLIC ICA/CCA RATIO:  1.5 ECA:  95 cm/sec LEFT ICA:  77/26 cm/sec CCA:  [id number] cm/sec SYSTOLIC ICA/CCA RATIO:  0.8 DIASTOLIC ICA/CCA RATIO:  1.3 ECA:  76 cm/sec RIGHT CAROTID ARTERY: No significant carotid atherosclerotic vascular disease. RIGHT VERTEBRAL ARTERY:  Patent with antegrade flow. LEFT CAROTID ARTERY: No significant carotid atherosclerotic vascular disease. LEFT VERTEBRAL ARTERY:  Patent with antegrade flow. IMPRESSION: 1. No significant carotid atherosclerotic vascular disease. 2. Vertebral arteries are patent with antegrade flow. Electronically Signed   By: Marcello Moores  Register   On: 01/13/2015 11:54    EKG:   Orders placed or performed during the hospital encounter of 01/12/15  . ED EKG  . ED EKG  . EKG 12-Lead  . EKG 12-Lead      Management plans discussed with the patient, family and they are in agreement.  CODE STATUS:     Code Status Orders        Start     Ordered   01/12/15 1828  Full code   Continuous     01/12/15 1827      TOTAL TIME TAKING CARE OF THIS PATIENT: 45 minutes.    @[facility] @  on 01/13/2015 at 3:58 PM  Between 7am to 6pm - Pager - [phone]  After 6pm go to [email or web address] - password [email or web address] [facility]  [facility]  Office  [phone]  CC: Primary care physician; [facility], Chrissie Noa, MD

## 2015-01-13 NOTE — Progress Notes (Signed)
[place], [place].   01/13/2015  Patient: Christopher Chen   Date of Birth:  1970/07/24  Date of admission:  01/12/2015  Date of Discharge  01/13/2015    To Whom it May Concern:   Steffanie Dunn  Should not drive until seen and  cleared by neurology in 6-8 weeks . Can return to work after seen and cleared by PCP in a week   PHYSICAL ACTIVITY:  None  If you have any questions or concerns, please don't hesitate to call.  Sincerely,   Nicholes Mango M.D Pager [phone] Office : [phone]   .

## 2015-02-12 DIAGNOSIS — G43109 Migraine with aura, not intractable, without status migrainosus: Secondary | ICD-10-CM | POA: Insufficient documentation

## 2015-02-12 DIAGNOSIS — R9089 Other abnormal findings on diagnostic imaging of central nervous system: Secondary | ICD-10-CM | POA: Insufficient documentation

## 2015-02-12 HISTORY — DX: Migraine with aura, not intractable, without status migrainosus: G43.109

## 2015-02-12 HISTORY — DX: Other abnormal findings on diagnostic imaging of central nervous system: R90.89

## 2015-03-03 DIAGNOSIS — G9389 Other specified disorders of brain: Secondary | ICD-10-CM

## 2015-03-03 HISTORY — DX: Other specified disorders of brain: G93.89

## 2015-05-13 ENCOUNTER — Emergency Department
Admission: EM | Admit: 2015-05-13 | Discharge: 2015-05-14 | Disposition: A | Discharge status: Home / Self Care | Payer: Worker's Compensation | Attending: Emergency Medicine | Admitting: Emergency Medicine

## 2015-05-13 DIAGNOSIS — Z7721 Contact with and (suspected) exposure to potentially hazardous body fluids: Secondary | ICD-10-CM | POA: Diagnosis not present

## 2015-05-13 DIAGNOSIS — Z23 Encounter for immunization: Secondary | ICD-10-CM | POA: Insufficient documentation

## 2015-05-13 DIAGNOSIS — I10 Essential (primary) hypertension: Secondary | ICD-10-CM | POA: Insufficient documentation

## 2015-05-13 DIAGNOSIS — Z7982 Long term (current) use of aspirin: Secondary | ICD-10-CM | POA: Insufficient documentation

## 2015-05-13 DIAGNOSIS — G459 Transient cerebral ischemic attack, unspecified: Secondary | ICD-10-CM | POA: Diagnosis not present

## 2015-05-13 NOTE — ED Notes (Signed)
Was exposed to blood while arresting someone and wants to be checked for possible exposure.

## 2015-05-14 MED ORDER — TETANUS-DIPHTH-ACELL PERTUSSIS 5-2.5-18.5 LF-MCG/0.5 IM SUSP
0.5000 mL | Freq: Once | INTRAMUSCULAR | Status: AC
  Administered 2015-05-14: 0.5 mL via INTRAMUSCULAR
  Filled 2015-05-14: qty 0.5

## 2015-05-14 NOTE — ED Provider Notes (Signed)
[facility] Emergency Department Provider Note  ____________________________________________  Time seen: Approximately 12:20 AM  I have reviewed the triage vital signs and the nursing notes.   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 Patient had a pre-existing small linear cut to his right third finger pad. He was exposed to blood while arresting a suspect. Unsure if blood actually got into his cut; vigorously washed his hands prior to arrival. Tetanus is not up-to-date, greater than 20 years. Patient voices no complaints. Denies striking head, LOC, headache, neck pain, chest pain, shortness of breath.   Past Medical History  Diagnosis Date  . Generalized headaches   . Arthritis     Patient Active Problem List   Diagnosis Date Noted  . TIA (transient ischemic attack) 01/12/2015  . Hypertension 01/12/2015    No past surgical history on file.  Current Outpatient Rx  Name  Route  Sig  Dispense  Refill  . aspirin EC 81 MG tablet   Oral   Take 1 tablet (81 mg total) by mouth daily.   30 tablet   0   . celecoxib (CELEBREX) 400 MG capsule   Oral   Take 400 mg by mouth daily.         Marland Kitchen EXPIRED: OXcarbazepine (TRILEPTAL) 150 MG tablet   Oral   Take 1 tablet (150 mg total) by mouth 2 (two) times daily.   14 tablet   0   . Oxcarbazepine (TRILEPTAL) 300 MG tablet   Oral   Take 1 tablet (300 mg total) by mouth 2 (two) times daily.   100 tablet   0     Start taking Trileptal 300 mg by mouth 2 times a d ...     Allergies Codeine and Morphine and related  No family history on file.  Social History Social History  Substance Use Topics  . Smoking status: Never Smoker   . Smokeless tobacco: Not on file  . Alcohol Use: No    Review of Systems  Constitutional: No fever/chills. Eyes: No visual  changes. ENT: No sore throat. Cardiovascular: Denies chest pain. Respiratory: Denies shortness of breath. Gastrointestinal: No abdominal pain.  No nausea, no vomiting.  No diarrhea.  No constipation. Genitourinary: Negative for dysuria. Musculoskeletal: Positive for blood exposure. Positive for small linear cut to right third finger pad. Negative for back pain. Skin: Negative for rash. Neurological: Negative for headaches, focal weakness or numbness.  10-point ROS otherwise negative.  ____________________________________________   PHYSICAL EXAM:  VITAL SIGNS: ED Triage Vitals  Enc Vitals Group     BP 05/13/15 2342 133/85 mmHg     Pulse Rate 05/13/15 2342 93     Resp 05/13/15 2342 18     Temp 05/13/15 2342 98.2 F (36.8 C)     Temp Source 05/13/15 2342 Oral     SpO2 05/13/15 2342 97 %     Weight 05/13/15 2342 240 lb (108.863 kg)     Height 05/13/15 2342 6\' 3"  (1.905 m)     Head Cir --      Peak Flow --      Pain Score --      Pain Loc --      Pain Edu? --      Excl. in [place]? --     Constitutional: Alert and oriented. Well appearing  and in no acute distress. Eyes: Conjunctivae are normal. PERRL. EOMI. Head: Atraumatic. Nose: No congestion/rhinnorhea. Mouth/Throat: Mucous membranes are moist.  Oropharynx non-erythematous. Neck: No stridor.   Cardiovascular: Normal rate, regular rhythm. Grossly normal heart sounds.  Good peripheral circulation. Respiratory: Normal respiratory effort.  No retractions. Lungs CTAB. Gastrointestinal: Soft and nontender. No distention. No abdominal bruits. No CVA tenderness. Musculoskeletal: Less then 0.5 cm linear cut to finger pad of right third digit which is well approximated and nonbleeding. No lower extremity tenderness nor edema.  No joint effusions. Neurologic:  Normal speech and language. No gross focal neurologic deficits are appreciated. No gait instability. Skin:  Skin is warm, dry and intact. No rash noted. Psychiatric: Mood and  affect are normal. Speech and behavior are normal.  ____________________________________________   LABS (all labs ordered are listed, but only abnormal results are displayed)  Labs Reviewed - No data to display ____________________________________________  EKG  None ____________________________________________  RADIOLOGY  None ____________________________________________   PROCEDURES  Procedure(s) performed: None  Critical Care performed: No  ____________________________________________   INITIAL IMPRESSION / ASSESSMENT AND PLAN / ED COURSE  Pertinent labs & imaging results that were available during my care of the patient were reviewed by me and considered in my medical decision making (see chart for details).  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 Suspects rapid HIV is negative. I have counseled patient against PEP. Will update tetanus shot and patient will follow-up with his occupational exposure clinic for follow-up. Strict return precautions given. Patient verbalizes understanding and agrees with plan of care. ____________________________________________   FINAL CLINICAL IMPRESSION(S) / ED DIAGNOSES  Final diagnoses:  Exposure to blood-borne pathogen      Paulette Blanch, MD 05/14/15 [phone]

## 2015-05-14 NOTE — Discharge Instructions (Signed)
Return to the ER for worsening symptoms, increased redness/swelling to finger, purulent discharge or other concerns.  Body Fluid Exposure Information People may come into contact with blood and other body fluids under various circumstances. In some cases, body fluids may contain germs (bacteria or viruses) that cause infections. These germs can be spread when another person's body fluids come into contact with your skin, mouth, eyes, or genitals.  Exposure to body fluids that may contain infectious material is a common problem for people providing care for others who are ill. It can occur when a person is performing health care tasks in the workplace or when taking care of a family member at home. Other common methods of exposure include injection drug use, sharing needles, and sexual activity. The risk of an infection spreading through body fluid exposure is small and depends on a variety of factors. This includes the type of body fluid, the nature of the exposure, and the health status of the person who was the source of the body fluids. Your health care provider can help you assess the risk. WHAT TYPES OF BODY FLUID CAN SPREAD INFECTION? The following types of body fluid have the potential to spread infections:  Blood.  Semen.  Vaginal secretions.  Urine.  Feces.  Saliva.  Nasal or eye discharge.  Breast milk.  Amniotic fluid and fluids surrounding body organs. WHAT ARE SOME FIRST-AID MEASURES FOR BODY FLUID EXPOSURE? The following steps should be taken as soon as possible after a person is exposed to body fluids: Intact Skin  For contact with closed skin, wash the area with soap and water. Broken Skin  For contact with broken skin (a wound), wash the area with soap and water. Let the area bleed a little. Then place a bandage or clean towel on the wound, applying gentle pressure to stop the bleeding. Do not squeeze or rub the area.  Use just water or hand sanitizer if a sink  with soap is not available.  Do not use harsh chemicals such as bleach or iodine. Eyes  Rinse the eyes with water or saline for 30 seconds.  If the person is wearing contact lenses, leave the contact lenses in while rinsing the eyes. Once the rinsing is complete, remove the contact lenses. Mouth  Spit out the fluids. Rinse and spit with water 4-5 times. In addition, you should remove any clothing that comes into contact with body fluids. However, if body fluid exposure results from sexual assault, seek medical care immediately without changing clothes or bathing. WHEN SHOULD YOU SEEK HELP? After performing the proper first-aid steps, you should contact your health care provider or seek emergency care right away if blood or other body fluids made contact with areas of broken skin or openings such as the eyes or mouth. If the exposure to body fluid happened in the workplace, you should report it to your work supervisor immediately. Many workplaces have procedures in place for exposure situations. WHAT WILL HAPPEN AFTER YOU REPORT THE EXPOSURE? Your health care provider will ask you several questions. Information requested may include:  Your medical history, including vaccination records.  Date and time of the exposure.  Whether you saw body fluids during the exposure.  Type of body fluid you were exposed to.  Volume of body fluid you were exposed to.  How the exposure happened.  If any devices, such as a needle, were being used.  Which area of your body made contact with the body fluid.  Description of  any injury to the skin or other area.  How long contact was made with the body fluid.  Any information you have about the health status of the person whose body fluid you were exposed to. The health care provider will assess your risk of infection. Often, no treatment is necessary. In some cases, the health care provider may recommend doing blood tests right away. Follow-up blood  tests may also be done at certain intervals during the upcoming weeks and months to check for changes. You may be offered treatment to prevent an infection from developing after exposure (post-exposure prophylaxis). This may include certain vaccinations or medicines and may be necessary when there is a risk of a serious infection, such as HIV or hepatitis B. Your health care provider should discuss appropriate treatment and vaccinations with you. HOW CAN YOU PREVENT EXPOSURE AND INFECTION? Always remember that prevention is the first line of defense against body fluid exposure. To help prevent exposure to body fluids:  Wash and disinfect countertops and other surfaces regularly.  Wear appropriate protective gear such as gloves, gowns, or eyewear when the possibility of exposure is present.  Wipe away spills of body fluid with disposable towels.  Properly dispose of blood products and other fluids. Use secured bags.  Properly dispose of needles and other instruments with sharp points or edges (sharps). Use closed, marked containers.  Avoid injection drug use.  Do not share needles.  Avoid recapping needles.  Use a condom during sexual intercourse.  Make sure you learn and follow any guidelines for preventing exposure (universal precautions) provided at your workplace. To help reduce your chances of getting an infection:  Make sure your vaccinations are up-to-date, including those for tetanus and hepatitis.  Wash your hands frequently with soap and water. Use hand sanitizers.  Avoid having multiple sex partners.  Follow up with your health care provider as directed after being evaluated for an exposure to body fluids. To avoid spreading infection to others:  Do not have sexual relations until you know you are free of infection.  Do not donate blood, plasma, breast milk, sperm, or other body fluids.  Do not share hygiene tools such as toothbrushes, razors, or dental floss.  Keep  open wounds covered.  Dispose of any items with blood on them (razors, tampons, bandages) by putting them in the trash.  Do not share drug supplies with others, such as needles, syringes, straws, or pipes.  Follow all of your health care provider's instructions for preventing the spread of infection.   This information is not intended to replace advice given to you by your health care provider. Make sure you discuss any questions you have with your health care provider.   Document Released: 09/26/2012 Document Revised: 01/29/2013 Document Reviewed: 09/26/2012 Elsevier Interactive Patient Education [employer].

## 2016-09-13 ENCOUNTER — Emergency Department: Payer: BC Managed Care – PPO

## 2016-09-13 ENCOUNTER — Emergency Department: Payer: BC Managed Care – PPO | Admitting: Anesthesiology

## 2016-09-13 ENCOUNTER — Encounter
Admission: EM | Disposition: A | Discharge status: Home / Self Care | Payer: Self-pay | Source: Home / Self Care | Attending: Emergency Medicine

## 2016-09-13 ENCOUNTER — Encounter: Payer: Self-pay | Admitting: Emergency Medicine

## 2016-09-13 ENCOUNTER — Observation Stay
Admission: EM | Admit: 2016-09-13 | Discharge: 2016-09-14 | Disposition: A | Discharge status: Home / Self Care | Payer: BC Managed Care – PPO | Attending: Surgery | Admitting: Surgery

## 2016-09-13 DIAGNOSIS — K611 Rectal abscess: Secondary | ICD-10-CM | POA: Diagnosis not present

## 2016-09-13 DIAGNOSIS — I10 Essential (primary) hypertension: Secondary | ICD-10-CM | POA: Insufficient documentation

## 2016-09-13 DIAGNOSIS — Z7982 Long term (current) use of aspirin: Secondary | ICD-10-CM | POA: Diagnosis not present

## 2016-09-13 DIAGNOSIS — K612 Anorectal abscess: Secondary | ICD-10-CM | POA: Diagnosis not present

## 2016-09-13 HISTORY — PX: INCISION AND DRAINAGE PERIRECTAL ABSCESS: SHX1804

## 2016-09-13 LAB — CBC WITH DIFFERENTIAL/PLATELET
BASOS ABS: 0.1 10*3/uL (ref 0–0.1)
Basophils Relative: 1 %
Eosinophils Absolute: 0.1 10*3/uL (ref 0–0.7)
Eosinophils Relative: 1 %
HEMATOCRIT: 42.4 % (ref 40.0–52.0)
Hemoglobin: 15.1 g/dL (ref 13.0–18.0)
LYMPHS PCT: 6 %
Lymphs Abs: 0.7 10*3/uL — ABNORMAL LOW (ref 1.0–3.6)
MCH: 34 pg (ref 26.0–34.0)
MCHC: 35.5 g/dL (ref 32.0–36.0)
MCV: 95.7 fL (ref 80.0–100.0)
Monocytes Absolute: 1 10*3/uL (ref 0.2–1.0)
Monocytes Relative: 9 %
NEUTROS ABS: 9 10*3/uL — AB (ref 1.4–6.5)
Neutrophils Relative %: 83 %
PLATELETS: 218 10*3/uL (ref 150–440)
RBC: 4.44 MIL/uL (ref 4.40–5.90)
RDW: 12.4 % (ref 11.5–14.5)
WBC: 10.8 10*3/uL — AB (ref 3.8–10.6)

## 2016-09-13 LAB — BASIC METABOLIC PANEL
ANION GAP: 8 (ref 5–15)
BUN: 21 mg/dL — ABNORMAL HIGH (ref 6–20)
CO2: 26 mmol/L (ref 22–32)
Calcium: 9 mg/dL (ref 8.9–10.3)
Chloride: 98 mmol/L — ABNORMAL LOW (ref 101–111)
Creatinine, Ser: 0.78 mg/dL (ref 0.61–1.24)
GFR calc Af Amer: >60 mL/min (ref >60)
Glucose, Bld: 94 mg/dL (ref 65–99)
POTASSIUM: 4.1 mmol/L (ref 3.5–5.1)
SODIUM: 132 mmol/L — AB (ref 135–145)

## 2016-09-13 SURGERY — INCISION AND DRAINAGE, ABSCESS, PERIRECTAL
Anesthesia: General | Site: Rectum | Wound class: Dirty or Infected

## 2016-09-13 MED ORDER — ASPIRIN EC 81 MG PO TBEC: 81.0000 mg | DELAYED_RELEASE_TABLET | Freq: Every day | ORAL | Status: DC

## 2016-09-13 MED ORDER — OXCARBAZEPINE 300 MG PO TABS
300.0000 mg | ORAL_TABLET | Freq: Two times a day (BID) | ORAL | Status: DC
  Filled 2016-09-13: qty 1

## 2016-09-13 MED ORDER — SODIUM CHLORIDE 0.9 % IV BOLUS (SEPSIS)
1000.0000 mL | Freq: Once | INTRAVENOUS | Status: AC
  Administered 2016-09-13: 1000 mL via INTRAVENOUS

## 2016-09-13 MED ORDER — ONDANSETRON HCL 4 MG/2ML IJ SOLN: 4.0000 mg | Freq: Four times a day (QID) | INTRAMUSCULAR | Status: DC | PRN

## 2016-09-13 MED ORDER — LACTATED RINGERS IV SOLN
INTRAVENOUS | Status: DC | PRN
  Administered 2016-09-13: 20:00:00 via INTRAVENOUS

## 2016-09-13 MED ORDER — DEXAMETHASONE SODIUM PHOSPHATE 10 MG/ML IJ SOLN
INTRAMUSCULAR | Status: DC | PRN
Start: 2016-09-13 — End: 2016-09-13
  Administered 2016-09-13: 10 mg via INTRAVENOUS

## 2016-09-13 MED ORDER — ONDANSETRON HCL 4 MG PO TABS: 4.0000 mg | ORAL_TABLET | Freq: Four times a day (QID) | ORAL | Status: DC | PRN

## 2016-09-13 MED ORDER — LIDOCAINE HCL (PF) 2 % IJ SOLN
INTRAMUSCULAR | Status: AC
  Filled 2016-09-13: qty 2

## 2016-09-13 MED ORDER — HYDROMORPHONE HCL 1 MG/ML IJ SOLN
0.5000 mg | Freq: Once | INTRAMUSCULAR | Status: AC
  Administered 2016-09-13: 0.5 mg via INTRAVENOUS
  Filled 2016-09-13: qty 1

## 2016-09-13 MED ORDER — HYDROMORPHONE HCL 1 MG/ML IJ SOLN: 0.5000 mg | INTRAMUSCULAR | Status: DC | PRN

## 2016-09-13 MED ORDER — PROPOFOL 10 MG/ML IV BOLUS
INTRAVENOUS | Status: AC
  Filled 2016-09-13: qty 20

## 2016-09-13 MED ORDER — FENTANYL CITRATE (PF) 100 MCG/2ML IJ SOLN
INTRAMUSCULAR | Status: AC
  Filled 2016-09-13: qty 2

## 2016-09-13 MED ORDER — PIPERACILLIN-TAZOBACTAM 3.375 G IVPB
3.3750 g | Freq: Three times a day (TID) | INTRAVENOUS | Status: DC
  Administered 2016-09-13 – 2016-09-14 (×2): 3.375 g via INTRAVENOUS
  Filled 2016-09-13 (×6): qty 50

## 2016-09-13 MED ORDER — PROPOFOL 10 MG/ML IV BOLUS
INTRAVENOUS | Status: DC | PRN
  Administered 2016-09-13: 200 mg via INTRAVENOUS

## 2016-09-13 MED ORDER — ONDANSETRON HCL 4 MG/2ML IJ SOLN
INTRAMUSCULAR | Status: DC | PRN
  Administered 2016-09-13: 4 mg via INTRAVENOUS

## 2016-09-13 MED ORDER — PIPERACILLIN-TAZOBACTAM 3.375 G IVPB 30 MIN
3.3750 g | Freq: Once | INTRAVENOUS | Status: AC
  Administered 2016-09-13: 3.375 g via INTRAVENOUS

## 2016-09-13 MED ORDER — FENTANYL CITRATE (PF) 100 MCG/2ML IJ SOLN
25.0000 ug | INTRAMUSCULAR | Status: DC | PRN
  Administered 2016-09-13 (×4): 25 ug via INTRAVENOUS

## 2016-09-13 MED ORDER — SUCCINYLCHOLINE CHLORIDE 20 MG/ML IJ SOLN
INTRAMUSCULAR | Status: DC | PRN
  Administered 2016-09-13: 120 mg via INTRAVENOUS

## 2016-09-13 MED ORDER — MIDAZOLAM HCL 2 MG/2ML IJ SOLN
INTRAMUSCULAR | Status: DC | PRN
  Administered 2016-09-13: 2 mg via INTRAVENOUS

## 2016-09-13 MED ORDER — LACTATED RINGERS IV SOLN
INTRAVENOUS | Status: DC
  Administered 2016-09-13 – 2016-09-14 (×2): via INTRAVENOUS

## 2016-09-13 MED ORDER — IOPAMIDOL (ISOVUE-300) INJECTION 61%
30.0000 mL | Freq: Once | INTRAVENOUS | Status: AC | PRN
  Administered 2016-09-13: 30 mL via ORAL

## 2016-09-13 MED ORDER — CELECOXIB 200 MG PO CAPS
400.0000 mg | ORAL_CAPSULE | Freq: Every day | ORAL | Status: DC
  Administered 2016-09-14: 400 mg via ORAL
  Filled 2016-09-13 (×2): qty 2

## 2016-09-13 MED ORDER — LIDOCAINE HCL (CARDIAC) 20 MG/ML IV SOLN
INTRAVENOUS | Status: DC | PRN
  Administered 2016-09-13: 50 mg via INTRAVENOUS

## 2016-09-13 MED ORDER — ONDANSETRON HCL 4 MG/2ML IJ SOLN
4.0000 mg | Freq: Once | INTRAMUSCULAR | Status: AC
  Administered 2016-09-13: 4 mg via INTRAVENOUS
  Filled 2016-09-13: qty 2

## 2016-09-13 MED ORDER — FENTANYL CITRATE (PF) 100 MCG/2ML IJ SOLN
INTRAMUSCULAR | Status: AC
  Administered 2016-09-13: 25 ug via INTRAVENOUS
  Filled 2016-09-13: qty 2

## 2016-09-13 MED ORDER — HYDROMORPHONE HCL 1 MG/ML IJ SOLN
0.5000 mg | Freq: Once | INTRAMUSCULAR | Status: AC
  Administered 2016-09-13: 0.5 mg via INTRAVENOUS

## 2016-09-13 MED ORDER — HEPARIN SODIUM (PORCINE) 5000 UNIT/ML IJ SOLN
5000.0000 [IU] | Freq: Three times a day (TID) | INTRAMUSCULAR | Status: DC
  Administered 2016-09-13 – 2016-09-14 (×2): 5000 [IU] via SUBCUTANEOUS
  Filled 2016-09-13 (×2): qty 1

## 2016-09-13 MED ORDER — IOPAMIDOL (ISOVUE-300) INJECTION 61%
100.0000 mL | Freq: Once | INTRAVENOUS | Status: AC | PRN
  Administered 2016-09-13: 100 mL via INTRAVENOUS

## 2016-09-13 MED ORDER — HYDROMORPHONE HCL 1 MG/ML IJ SOLN
INTRAMUSCULAR | Status: AC
  Filled 2016-09-13: qty 1

## 2016-09-13 MED ORDER — SUCCINYLCHOLINE CHLORIDE 20 MG/ML IJ SOLN
INTRAMUSCULAR | Status: AC
  Filled 2016-09-13: qty 1

## 2016-09-13 MED ORDER — SODIUM CHLORIDE 0.9 % IV BOLUS (SEPSIS): 1000.0000 mL | Freq: Once | INTRAVENOUS | Status: DC

## 2016-09-13 MED ORDER — FENTANYL CITRATE (PF) 100 MCG/2ML IJ SOLN
INTRAMUSCULAR | Status: DC | PRN
  Administered 2016-09-13 (×2): 50 ug via INTRAVENOUS

## 2016-09-13 MED ORDER — MORPHINE SULFATE (PF) 4 MG/ML IV SOLN: 2.0000 mg | INTRAVENOUS | Status: DC | PRN

## 2016-09-13 MED ORDER — PROMETHAZINE HCL 25 MG/ML IJ SOLN: 6.2500 mg | INTRAMUSCULAR | Status: DC | PRN

## 2016-09-13 MED ORDER — MIDAZOLAM HCL 2 MG/2ML IJ SOLN
INTRAMUSCULAR | Status: AC
  Filled 2016-09-13: qty 2

## 2016-09-13 MED ORDER — PHENYLEPHRINE HCL 10 MG/ML IJ SOLN
INTRAMUSCULAR | Status: AC
  Filled 2016-09-13: qty 1

## 2016-09-13 MED ORDER — TRAMADOL HCL 50 MG PO TABS
50.0000 mg | ORAL_TABLET | Freq: Four times a day (QID) | ORAL | Status: DC | PRN
  Administered 2016-09-13: 50 mg via ORAL
  Filled 2016-09-13: qty 1

## 2016-09-13 SURGICAL SUPPLY — 23 items
BLADE SURG 15 STRL LF DISP TIS (BLADE) ×1 IMPLANT
BLADE SURG 15 STRL SS (BLADE) ×2
BLADE SURG SZ11 CARB STEEL (BLADE) ×2 IMPLANT
BRIEF STRETCH MATERNITY 2XLG (MISCELLANEOUS) ×2 IMPLANT
CANISTER SUCT 1200ML W/VALVE (MISCELLANEOUS) ×2 IMPLANT
DRAIN PENROSE 1/4X12 LTX (DRAIN) ×2 IMPLANT
DRAPE LAPAROTOMY 100X77 ABD (DRAPES) ×2 IMPLANT
DRAPE LEGGINS SURG 28X43 STRL (DRAPES) ×2 IMPLANT
DRAPE UNDER BUTTOCK W/FLU (DRAPES) ×2 IMPLANT
GAUZE PACKING IODOFORM 1/2 (PACKING) ×2 IMPLANT
GAUZE SPONGE 4X4 12PLY STRL (GAUZE/BANDAGES/DRESSINGS) ×2 IMPLANT
GLOVE BIO SURGEON STRL SZ8 (GLOVE) ×4 IMPLANT
GOWN STRL REUS W/ TWL LRG LVL3 (GOWN DISPOSABLE) ×2 IMPLANT
GOWN STRL REUS W/TWL LRG LVL3 (GOWN DISPOSABLE) ×4
KIT RM TURNOVER STRD PROC AR (KITS) ×2 IMPLANT
LABEL OR SOLS (LABEL) ×2 IMPLANT
NS IRRIG 500ML POUR BTL (IV SOLUTION) ×2 IMPLANT
PACK BASIN MINOR ARMC (MISCELLANEOUS) ×2 IMPLANT
PAD ABD DERMACEA PRESS 5X9 (GAUZE/BANDAGES/DRESSINGS) ×4 IMPLANT
PREP PVP WINGED SPONGE (MISCELLANEOUS) ×2 IMPLANT
SUT ETH BLK MONO 3 0 FS 1 12/B (SUTURE) ×2 IMPLANT
SUT NYLON 2-0 (SUTURE) ×2 IMPLANT
SWAB CULTURE AMIES ANAERIB BLU (MISCELLANEOUS) ×2 IMPLANT

## 2016-09-13 NOTE — Anesthesia Procedure Notes (Signed)
Procedure Name: Intubation Date/Time: 09/13/2016 8:02 PM Performed by: Lendon Colonel Pre-anesthesia Checklist: Patient identified, Patient being monitored, Timeout performed, Emergency Drugs available and Suction available Patient Re-evaluated:Patient Re-evaluated prior to induction Oxygen Delivery Method: Circle system utilized Preoxygenation: Pre-oxygenation with 100% oxygen Induction Type: IV induction Ventilation: Mask ventilation without difficulty Laryngoscope Size: Miller and 2 Grade View: Grade II Tube type: Oral Tube size: 7.5 mm Number of attempts: 1 Airway Equipment and Method: Stylet Placement Confirmation: ETT inserted through vocal cords under direct vision,  positive ETCO2 and breath sounds checked- equal and bilateral Secured at: 22 cm Tube secured with: Tape Dental Injury: Teeth and Oropharynx as per pre-operative assessment

## 2016-09-13 NOTE — ED Provider Notes (Signed)
[facility] Emergency Department Provider Note ____________________________________________   I have reviewed the triage vital signs and the triage nursing note.  HISTORY  Chief Complaint Abscess   Historian Patient  HPI Christopher Chen is a 46 y.o. male presenting for rectal pain for about 3 days now. He does have a history of prior hemorrhoids, banding with Dr. Vira Agar in the past. States that pain started about 2-3 days ago have been worsening. Pain is worse these ever had before. He's now feeling generalized body aches and nausea/sweats. No fever.  Pain is severe and located at the left buttock/left rectum.    Past Medical History:  Diagnosis Date  . Arthritis   . Generalized headaches     Patient Active Problem List   Diagnosis Date Noted  . TIA (transient ischemic attack) 01/12/2015  . Hypertension 01/12/2015    History reviewed. No pertinent surgical history.  Prior to Admission medications   Medication Sig Start Date End Date Taking? Authorizing Provider  aspirin EC 81 MG tablet Take 1 tablet (81 mg total) by mouth daily. 01/13/15   Nicholes Mango, MD  celecoxib (CELEBREX) 400 MG capsule Take 400 mg by mouth daily.    [provider]  OXcarbazepine (TRILEPTAL) 150 MG tablet Take 1 tablet (150 mg total) by mouth 2 (two) times daily. 01/13/15 01/19/15  Nicholes Mango, MD  Oxcarbazepine (TRILEPTAL) 300 MG tablet Take 1 tablet (300 mg total) by mouth 2 (two) times daily. 01/20/15   Nicholes Mango, MD    Allergies  Allergen Reactions  . Codeine Nausea And Vomiting  . Morphine And Related Itching    No family history on file.  Social History Social History  Substance Use Topics  . Smoking status: Never Smoker  . Smokeless tobacco: Never Used  . Alcohol use No    Review of Systems  Constitutional: Negative for fever. Eyes: Negative for visual changes. ENT: Negative for sore throat. Cardiovascular: Negative for chest  pain. Respiratory: Negative for shortness of breath. Gastrointestinal: Negative for abdominal pain, vomiting and diarrhea. Genitourinary: Negative for dysuria. Musculoskeletal: Negative for back pain. Skin: Negative for rash. Neurological: Negative for headache.  ____________________________________________   PHYSICAL EXAM:  VITAL SIGNS: ED Triage Vitals  Enc Vitals Group     BP 09/13/16 1231 (!) 142/91     Pulse Rate 09/13/16 1231 94     Resp 09/13/16 1231 16     Temp 09/13/16 1231 98.5 F (36.9 C)     Temp Source 09/13/16 1231 Oral     SpO2 09/13/16 1231 96 %     Weight 09/13/16 1233 230 lb (104.3 kg)     Height 09/13/16 1233 6\' 2"  (1.88 m)     Head Circumference --      Peak Flow --      Pain Score 09/13/16 1229 6     Pain Loc --      Pain Edu? --      Excl. in [place]? --      Constitutional: Alert and oriented. Well appearing and in no distress. HEENT   Head: Normocephalic and atraumatic.      Eyes: Conjunctivae are normal. Pupils equal and round.       Ears:         Nose: No congestion/rhinnorhea.   Mouth/Throat: Mucous membranes are moist.   Neck: No stridor. Cardiovascular/Chest: Normal rate, regular rhythm.  No murmurs, rubs, or gallops. Respiratory: Normal respiratory effort without tachypnea nor retractions. Breath sounds are clear and  equal bilaterally. No wheezes/rales/rhonchi. Gastrointestinal: Soft. No distention, no guarding, no rebound. Nontender.   Genitourinary/rectal: Tender swollen mass involving the rectum at the posterior left side of the rectum.  No obvious external hemorrhoid, however some purplish discoloration. No surrounding cellulitis. Very especially tender to palpation. Musculoskeletal: Nontender with normal range of motion in all extremities. No joint effusions.  No lower extremity tenderness.  No edema. Neurologic:  Normal speech and language. No gross or focal neurologic deficits are appreciated. Skin:  Skin is warm, dry and  intact. No rash noted. Psychiatric: Mood and affect are normal. Speech and behavior are normal. Patient exhibits appropriate insight and judgment.   ____________________________________________  LABS (pertinent positives/negatives)  Labs Reviewed  BASIC METABOLIC PANEL - Abnormal; Notable for the following:       Result Value   Sodium 132 (*)    Chloride 98 (*)    BUN 21 (*)    All other components within normal limits  CBC WITH DIFFERENTIAL/PLATELET - Abnormal; Notable for the following:    WBC 10.8 (*)    Neutro Abs 9.0 (*)    Lymphs Abs 0.7 (*)    All other components within normal limits  CULTURE, BLOOD (ROUTINE X 2)  CULTURE, BLOOD (ROUTINE X 2)    ____________________________________________    EKG I, Lisa Roca, MD, the attending physician have personally viewed and interpreted all ECGs.  None ____________________________________________  RADIOLOGY All Xrays were viewed by me. Imaging interpreted by Radiologist.  CT pelvis with contrast:  IMPRESSION: Probable developing perirectal abscess extending into the medial LEFT buttock, not yet entirely well defined but overall area measuring approximately 5.7 x 2.4 x 2.9 cm with additional mild surrounding infiltrative changes. __________________________________________  PROCEDURES  Procedure(s) performed: None  Critical Care performed: None  ____________________________________________   ED COURSE / ASSESSMENT AND PLAN  Pertinent labs & imaging results that were available during my care of the patient were reviewed by me and considered in my medical decision making (see chart for details).   Mr. Sokolowski has a exquisitely tender mass involving the rectum, not very much cellulitis or fluctuance, however I still suspect abscess more so then Cardilate hemorrhoid.  White blood count is somewhat elevated. A medicine for culture as well. I will CT her pelvis for better evaluation.  CT shows consistent with left  perirectal abscess. I did consult Dr. Dahlia Byes, through surgical/R nurse. Patient will consult in the ED.    CONSULTATIONS:  Dr. Dahlia Byes, general surgery.   Patient / Family / Caregiver informed of clinical course, medical decision-making process, and agree with plan.   ___________________________________________   FINAL CLINICAL IMPRESSION(S) / ED DIAGNOSES   Final diagnoses:  Perirectal abscess              Note: This dictation was prepared with Dragon dictation. Any transcriptional errors that result from this process are unintentional    Lisa Roca, MD 09/13/16 1623

## 2016-09-13 NOTE — ED Notes (Signed)
Pt to ed with c/o ?abscess to rectal area x 2 days.  Area on left side of rectum, red, swollen, tender to touch.

## 2016-09-13 NOTE — ED Triage Notes (Signed)
C/O abscess to buttocks x 1 day.

## 2016-09-13 NOTE — H&P (Signed)
Patient ID: Christopher Chen, male   DOB: 1970-08-16, 46 y.o.   MRN: [id number]  HPI Daryle Amis is a 46 y.o. male comes with a 2 day history of severe anorectal pain. Patient reports the pain is worse when he sits And puts pressure on her buttocks. Never had this is Christine pain like this before. Pain is sharp and no specific aggravating factors. No fevers but he reports feeling some malaise and questionable chills. Workup included a CT scan that I have personally reviewed there is evidence of significant induration on the left perianal area. White count is 10.8, normal creatinine and increase in BUN.   HPI  Past Medical History:  Diagnosis Date  . Arthritis   . Generalized headaches     History reviewed. No pertinent surgical history.  No family history on file.  Social History Social History  Substance Use Topics  . Smoking status: Never Smoker  . Smokeless tobacco: Never Used  . Alcohol use No    Allergies  Allergen Reactions  . Codeine Nausea And Vomiting  . Morphine And Related Itching    No current facility-administered medications for this encounter.    Current Outpatient Prescriptions  Medication Sig Dispense Refill  . aspirin EC 81 MG tablet Take 1 tablet (81 mg total) by mouth daily. 30 tablet 0  . celecoxib (CELEBREX) 400 MG capsule Take 400 mg by mouth daily.    . OXcarbazepine (TRILEPTAL) 150 MG tablet Take 1 tablet (150 mg total) by mouth 2 (two) times daily. 14 tablet 0  . Oxcarbazepine (TRILEPTAL) 300 MG tablet Take 1 tablet (300 mg total) by mouth 2 (two) times daily. 100 tablet 0     Review of Systems Full ROS  was asked and was negative except for the information on the HPI  Physical Exam Blood pressure 125/79, pulse 78, temperature 98.5 F (36.9 C), temperature source Oral, resp. rate 16, height 6\' 2"  (1.88 m), weight 104.3 kg (230 lb), SpO2 93 %. CONSTITUTIONAL: NAD, anxious EYES: Pupils are equal, round, and reactive to light, Sclera are  non-icteric. EARS, NOSE, MOUTH AND THROAT: The oropharynx is clear. The oral mucosa is pink and moist. Hearing is intact to voice. LYMPH NODES:  Lymph nodes in the neck are normal. RESPIRATORY:  Lungs are clear. There is normal respiratory effort, with equal breath sounds bilaterally, and without pathologic use of accessory muscles. CARDIOVASCULAR: Heart is regular without murmurs, gallops, or rubs. GI: The abdomen is  soft, nontender, and nondistended. There are no palpable masses. There is no hepatosplenomegaly. There are normal bowel sounds in all quadrants. GU: There is exquisite tenderness to palpation and fluctuance located to the left on the posterior midline. No evidence of necrotizing infection  MUSCULOSKELETAL: Normal muscle strength and tone. No cyanosis or edema.   SKIN: Turgor is good and there are no pathologic skin lesions or ulcers. NEUROLOGIC: Motor and sensation is grossly normal. Cranial nerves are grossly intact. PSYCH:  Oriented to person, place and time. Affect is normal.  Data Reviewed  I have personally reviewed the patient's imaging, laboratory findings and medical records.    Assessment/ Plan  46 year old presented with classic signs and symptoms consistent with perianal abscess. Discussed with the patient and family in detail about his disease process. I do recommend proceeding to the operating room for an exam under anesthesia and drainage of the abscesses. Discussed with them in detail about the procedure, the risk, benefits and possible complications including but not limited to: Recurrence, fistula formation,  injury to adjacent structures and chronic pain. They understand and they wish to proceed. He was very anxious and but I answered all of his questions in detail. I also discussed with him that Dr. Burt Knack will be the operating surgeon since there is an emergency case going on right now in the OR. We'll perform the I&D in the OR tonight In the meantime we'll  start him on broad-spectrum antibiotics and give him a crystalloid bolus.  Caroleen Hamman, MD FACS General Surgeon 09/13/2016, 6:21 PM

## 2016-09-13 NOTE — ED Notes (Signed)
Pt taken to CT via stretcher, pt asking for more pain medication. Dr. Reita Cliche updated.

## 2016-09-13 NOTE — Transfer of Care (Signed)
Immediate Anesthesia Transfer of Care Note  Patient: Christopher Chen  Procedure(s) Performed: Procedure(s): IRRIGATION AND DEBRIDEMENT PERIRECTAL ABSCESS (N/A)  Patient Location: PACU  Anesthesia Type:General  Level of Consciousness: awake, alert , oriented and patient cooperative  Airway & Oxygen Therapy: Patient Spontanous Breathing  Post-op Assessment: Report given to RN and Post -op Vital signs reviewed and stable  Post vital signs: Reviewed and stable  Last Vitals:  Vitals:   09/13/16 1700 09/13/16 1730  BP: 125/79 130/89  Pulse: 78   Resp:    Temp:      Last Pain:  Vitals:   09/13/16 1703  TempSrc:   PainSc: 4          Complications: No apparent anesthesia complications

## 2016-09-13 NOTE — Discharge Instructions (Signed)
Calls surgical clinic today and told to follow-up from the emergency room.

## 2016-09-13 NOTE — Op Note (Signed)
09/13/2016  8:35 PM  PATIENT:  Christopher Chen  46 y.o. male  PRE-OPERATIVE DIAGNOSIS:  Perirectal abscess  POST-OPERATIVE DIAGNOSIS:  Perirectal abscess  PROCEDURE: Incision and drainage of perirectal abscess  SURGEON:  Florene Glen MD, FACS  ANESTHESIA:   Gen. with LMA   Details of Procedure: This patient with a perirectal abscess. He is here for elective or semiurgent examination under anesthesia and incision and drainage of a perirectal abscess. Preoperatively we discussed the options of observation and the risk of bleeding infection recurrence of fistula occurrence. He understood and agreed to proceed.  Patient was induced to general anesthesia and draped sterile fashion is placed in high lithotomy position. A surgical pause was performed identifying the patient properly. Examination under anesthesia demonstrated a hard erythematous mass in the left buttock with fluctuance. An 11 blade was placed into the area of fluctuance and purulence exuded. This was cultured. A Yankauer sucker was placed into the cavity and loculations were broken up. The area was irrigated with copious metastases normal saline and then a small Penrose drain was placed into the cavity and held in with 3-0 nylon. A sterile dressing was placed.  Patient was taken out of lithotomy position burn panties were placed with a dressing. And he was taken to recovery room in stable condition to be admitted for continued care as Mabel loss was nil and sponge lap needle count was correct.   Florene Glen, MD FACS

## 2016-09-13 NOTE — Progress Notes (Signed)
Preoperative Review   Patient is met in the preoperative holding area. The history is reviewed in the chart and with the patient. I personally reviewed the options and rationale as well as the risks of this procedure that have been previously discussed with the patient. Dr. Perrin Maltese discussed all of this in detail and I reviewed for the patient the options as well as the risks of bleeding infection recurrence and fistula formation. Patient had multiple questions again which were answered to the best of my ability. He understood and agreed with this plan. All questions asked by the patient and/or family were answered to their satisfaction.  Patient agrees to proceed with this procedure at this time.  Florene Glen M.D. FACS

## 2016-09-13 NOTE — Anesthesia Post-op Follow-up Note (Signed)
Anesthesia QCDR form completed.        

## 2016-09-13 NOTE — Anesthesia Preprocedure Evaluation (Signed)
Anesthesia Evaluation  Patient identified by MRN, date of birth, ID band Patient awake    Reviewed: Allergy & Precautions, H&P , NPO status , Patient's Chart, lab work & pertinent test results, reviewed documented beta blocker date and time   History of Anesthesia Complications Negative for: history of anesthetic complications  Airway Mallampati: I  TM Distance: >3 FB Neck ROM: full    Dental no notable dental hx. (+) Dental Advidsory Given, Teeth Intact   Pulmonary neg pulmonary ROS,           Cardiovascular Exercise Tolerance: Good negative cardio ROS       Neuro/Psych  Headaches, negative psych ROS   GI/Hepatic negative GI ROS, Neg liver ROS,   Endo/Other  negative endocrine ROS  Renal/GU negative Renal ROS  negative genitourinary   Musculoskeletal   Abdominal   Peds  Hematology negative hematology ROS (+)   Anesthesia Other Findings Past Medical History: No date: Arthritis No date: Generalized headaches   Reproductive/Obstetrics negative OB ROS                             Anesthesia Physical Anesthesia Plan  ASA: I  Anesthesia Plan: General   Post-op Pain Management:    Induction: Intravenous  PONV Risk Score and Plan: 2 and Ondansetron and Dexamethasone  Airway Management Planned: Oral ETT  Additional Equipment:   Intra-op Plan:   Post-operative Plan: Extubation in OR  Informed Consent: I have reviewed the patients History and Physical, chart, labs and discussed the procedure including the risks, benefits and alternatives for the proposed anesthesia with the patient or authorized representative who has indicated his/her understanding and acceptance.   Dental Advisory Given  Plan Discussed with: Anesthesiologist, CRNA and Surgeon  Anesthesia Plan Comments:         Anesthesia Quick Evaluation

## 2016-09-13 NOTE — ED Notes (Signed)
CT notified pt is done with oral contrast.

## 2016-09-14 ENCOUNTER — Encounter: Payer: Self-pay | Admitting: Surgery

## 2016-09-14 MED ORDER — TRAMADOL HCL 50 MG PO TABS
50.0000 mg | ORAL_TABLET | Freq: Four times a day (QID) | ORAL | 0 refills | Status: DC | PRN
Start: 2016-09-14 — End: 2016-09-19

## 2016-09-14 MED ORDER — AMOXICILLIN-POT CLAVULANATE 875-125 MG PO TABS: 1.0000 | ORAL_TABLET | Freq: Two times a day (BID) | ORAL | 0 refills | Status: AC

## 2016-09-14 NOTE — Progress Notes (Signed)
Discharge  Pt was able to participate in discharge teaching. PIV was removed and pt was allowed to dress. Pt had many questions that were answered through the discharge instructions and a letter for work was just obtained from MD. Pt instructed on prescribed medications, follow-up appts, and dressing changes.

## 2016-09-14 NOTE — Discharge Summary (Signed)
  Patient ID: Christopher Chen MRN: [id number] DOB/AGE: 46-Feb-1972 46 y.o.  Admit date: 09/13/2016 Discharge date: 09/14/2016   Discharge Diagnoses:  Active Problems:   Perirectal abscess   Procedures: I&D of perianal abscess  Hospital Course: 46 year old male admitted with a perianal abscess probably drain in the operating room. A penRose drain was placed and he was kept overnight. His vital signs remained stable he was afebrile. At time of discharge he was in no acute distress, awake and alert. His abdomen was soft and nontender. Perineal wound was healing well with a Penrose in place no evidence of necrotizing infection.  He will continue daily wound care as well as antibiotic therapy and sitz baths. Condition at discharge is stable    Disposition: 01-Home or Self Care  Discharge Instructions    Call MD for:  difficulty breathing, headache or visual disturbances    Complete by:  As directed    Call MD for:  extreme fatigue    Complete by:  As directed    Call MD for:  hives    Complete by:  As directed    Call MD for:  persistant dizziness or light-headedness    Complete by:  As directed    Call MD for:  persistant nausea and vomiting    Complete by:  As directed    Call MD for:  redness, tenderness, or signs of infection (pain, swelling, redness, odor or green/yellow discharge around incision site)    Complete by:  As directed    Call MD for:  severe uncontrolled pain    Complete by:  As directed    Call MD for:  temperature >100.4    Complete by:  As directed    Change dressing (specify)    Complete by:  As directed    Dressing change: BID w Dry gauze or sanitary pad.   Diet - low sodium heart healthy    Complete by:  As directed    Discharge instructions    Complete by:  As directed    Sitz baths BID   Increase activity slowly    Complete by:  As directed      Allergies as of 09/14/2016      Reactions   Codeine Nausea And Vomiting   Morphine And Related Itching       Medication List    TAKE these medications   amoxicillin-clavulanate 875-125 MG tablet Commonly known as:  AUGMENTIN Take 1 tablet by mouth 2 (two) times daily.   celecoxib 400 MG capsule Commonly known as:  CELEBREX Take 400 mg by mouth daily.   traMADol 50 MG tablet Commonly known as:  ULTRAM Take 1 tablet (50 mg total) by mouth every 6 (six) hours as needed for moderate pain.      Follow-up Information    Florene Glen, MD. Go on 09/22/2016.   Specialty:  Surgery Why:  Dr. Burt Knack, Thursday, August 16 at 9:30 a.m. [phone] Contact information: [place] [address] [phone]            Caroleen Hamman, MD FACS

## 2016-09-15 ENCOUNTER — Ambulatory Visit (INDEPENDENT_AMBULATORY_CARE_PROVIDER_SITE_OTHER): Payer: BC Managed Care – PPO | Admitting: General Surgery

## 2016-09-15 ENCOUNTER — Telehealth: Payer: Self-pay | Admitting: General Practice

## 2016-09-15 ENCOUNTER — Encounter: Payer: Self-pay | Admitting: General Surgery

## 2016-09-15 VITALS — BP 153/94 | HR 69 | Temp 98.1°F | Ht 74.0 in | Wt 226.6 lb

## 2016-09-15 DIAGNOSIS — Z4889 Encounter for other specified surgical aftercare: Secondary | ICD-10-CM

## 2016-09-15 LAB — HIV ANTIBODY (ROUTINE TESTING W REFLEX): HIV Screen 4th Generation wRfx: NONREACTIVE

## 2016-09-15 NOTE — Telephone Encounter (Signed)
Patient is calling asking if he is able to take a bath after having surgery done on 09/13/16 with Dr. Burt Knack he had a Incision and drainage of perirectal abscess. Please call patient and advice.
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
## 2016-09-15 NOTE — Telephone Encounter (Signed)
Returned phone call to patient at this time. He is very anxious on the phone throughout the call. He informed me that he is doing baths 3 times daily but is unsure what a sitz bath is. We discussed the difference and that him bathing is perfectly fine. He also has concerns with the packing. He pulled the packing out yesterday and was unable to repack the area. Penrose still in place. He states that it is very uncomfortable without the packing in daily and requesting this be checked.  He has not had a BM since surgery and is very nervous about this. He is taking Colace 100mg  TID. I explained that he may add Miralax 17g 1-2 capfuls daily to aide in having a BM.He wrote down and read back all instructions.  Appointment was made for today to see Dr. Adonis Huguenin.

## 2016-09-15 NOTE — Anesthesia Postprocedure Evaluation (Signed)
Anesthesia Post Note  Patient: Gunner Iodice  Procedure(s) Performed: Procedure(s) (LRB): IRRIGATION AND DEBRIDEMENT PERIRECTAL ABSCESS (N/A)  Patient location during evaluation: PACU Anesthesia Type: General Level of consciousness: awake and alert Pain management: pain level controlled Vital Signs Assessment: post-procedure vital signs reviewed and stable Respiratory status: spontaneous breathing, nonlabored ventilation, respiratory function stable and patient connected to nasal cannula oxygen Cardiovascular status: blood pressure returned to baseline and stable Postop Assessment: no signs of nausea or vomiting Anesthetic complications: no     Last Vitals:  Vitals:   09/14/16 0448 09/14/16 1300  BP: 115/65 120/72  Pulse: 80 81  Resp: 18 19  Temp: 36.7 C 36.7 C  SpO2: 93% 94%    Last Pain:  Vitals:   09/14/16 1300  TempSrc: Oral  PainSc:                  Martha Clan

## 2016-09-15 NOTE — Patient Instructions (Signed)
You may shower and wash the area with soap and water. Please do not take tub baths at this time. Please apply a dressing over the area to adsorb any drainage from the area. You may try a Kotex pad as this usually works well. We would like for you to clean this area twice daily and after bowel movements. Please see your follow up appointment listed below.    How to Take a Sitz Bath A sitz bath is a warm water bath that is taken while you are sitting down. The water should only come up to your hips and should cover your buttocks. Your health care provider may recommend a sitz bath to help you:  Clean the lower part of your body, including your genital area.  With itching.  With pain.  With sore muscles or muscles that tighten or spasm.  How to take a sitz bath Take 3-4 sitz baths per day or as told by your health care provider. 1. Partially fill a bathtub with warm water. You will only need the water to be deep enough to cover your hips and buttocks when you are sitting in it. 2. If your health care provider told you to put medicine in the water, follow the directions exactly. 3. Sit in the water and open the tub drain a little. 4. Turn on the warm water again to keep the tub at the correct level. Keep the water running constantly. 5. Soak in the water for 15-20 minutes or as told by your health care provider. 6. After the sitz bath, pat the affected area dry first. Do not rub it. 7. Be careful when you stand up after the sitz bath because you may feel dizzy.  Contact a health care provider if:  Your symptoms get worse. Do not continue with sitz baths if your symptoms get worse.  You have new symptoms. Do not continue with sitz baths until you talk with your health care provider. This information is not intended to replace advice given to you by your health care provider. Make sure you discuss any questions you have with your health care provider. Document Released: 10/17/2003  Document Revised: 06/24/2015 Document Reviewed: 01/22/2014 Elsevier Interactive Patient Education  [employer].

## 2016-09-18 LAB — CULTURE, BLOOD (ROUTINE X 2)
Culture: NO GROWTH
Culture: NO GROWTH
SPECIAL REQUESTS: ADEQUATE

## 2016-09-19 ENCOUNTER — Ambulatory Visit (INDEPENDENT_AMBULATORY_CARE_PROVIDER_SITE_OTHER): Payer: BC Managed Care – PPO | Admitting: Surgery

## 2016-09-19 ENCOUNTER — Encounter: Payer: Self-pay | Admitting: Surgery

## 2016-09-19 VITALS — BP 112/73 | HR 72 | Temp 97.7°F | Ht 74.0 in | Wt 223.0 lb

## 2016-09-19 DIAGNOSIS — Z4889 Encounter for other specified surgical aftercare: Secondary | ICD-10-CM

## 2016-09-19 LAB — AEROBIC/ANAEROBIC CULTURE (SURGICAL/DEEP WOUND)

## 2016-09-19 LAB — AEROBIC/ANAEROBIC CULTURE W GRAM STAIN (SURGICAL/DEEP WOUND)

## 2016-09-19 NOTE — Patient Instructions (Signed)
Continued to take your antibiotics until you finish them.  Your wound will continue to drain for a few more days. You may continue the sitz baths as needed.   When you feel up to going back to work please give our office a call and we will fax over a work note for you.  We will see you back as listed below:

## 2016-09-19 NOTE — Progress Notes (Signed)
Outpatient postop visit  09/19/2016  Christopher Chen is an 46 y.o. male.    Procedure: I&D of a perirectal abscess  CC: Minimal drainage  HPI: This patient underwent I&D of a perirectal abscess the Penrose drain was placed. He is here for follow-up. Patient still feels weak but is getting better. He's eating well no fevers or chills  Medications reviewed.    Physical Exam:  There were no vitals taken for this visit.    PE: No erythema or drainage the Penrose drain is removed no purulence soft nontender area    Assessment/Plan:  Cultures were negative. Patient doing very well recommend follow up on an as-needed basis for could come back next week for wound check if he continues to drain.  Florene Glen, MD, FACS

## 2016-09-22 ENCOUNTER — Encounter: Payer: Self-pay | Admitting: Surgery

## 2016-09-23 ENCOUNTER — Telehealth: Payer: Self-pay

## 2016-09-23 NOTE — Telephone Encounter (Signed)
Patient's disability form was filled out and faxed. 

## 2016-09-28 ENCOUNTER — Encounter: Payer: Self-pay | Admitting: Surgery

## 2016-12-28 ENCOUNTER — Encounter: Payer: Self-pay | Admitting: Emergency Medicine

## 2016-12-28 ENCOUNTER — Telehealth: Payer: Self-pay

## 2016-12-28 ENCOUNTER — Emergency Department
Admission: EM | Admit: 2016-12-28 | Discharge: 2016-12-28 | Disposition: A | Discharge status: Home / Self Care | Payer: BC Managed Care – PPO | Attending: Emergency Medicine | Admitting: Emergency Medicine

## 2016-12-28 DIAGNOSIS — L739 Follicular disorder, unspecified: Secondary | ICD-10-CM | POA: Diagnosis not present

## 2016-12-28 DIAGNOSIS — L539 Erythematous condition, unspecified: Secondary | ICD-10-CM | POA: Diagnosis present

## 2016-12-28 DIAGNOSIS — I10 Essential (primary) hypertension: Secondary | ICD-10-CM | POA: Diagnosis not present

## 2016-12-28 DIAGNOSIS — Z79899 Other long term (current) drug therapy: Secondary | ICD-10-CM | POA: Insufficient documentation

## 2016-12-28 MED ORDER — SULFAMETHOXAZOLE-TRIMETHOPRIM 800-160 MG PO TABS
1.0000 | ORAL_TABLET | Freq: Two times a day (BID) | ORAL | 0 refills | Status: DC
Start: 2016-12-28 — End: 2017-02-22

## 2016-12-28 MED ORDER — SULFAMETHOXAZOLE-TRIMETHOPRIM 800-160 MG PO TABS
1.0000 | ORAL_TABLET | Freq: Once | ORAL | Status: AC
  Administered 2016-12-28: 1 via ORAL
  Filled 2016-12-28: qty 1

## 2016-12-28 NOTE — ED Notes (Signed)
Small raised area firm to coccyx area on left side.  Appears like abscess.  No drainage.

## 2016-12-28 NOTE — Telephone Encounter (Signed)
Patient feels a lump where he had a recent perirectal abscess.   He took his shower and felt the area. He states it is becoming uncomfortable to sit, and there is redness with a small red bump that appears to be draining. Patient was instructed to go to the emergency room due to office closing for the day.

## 2016-12-28 NOTE — ED Triage Notes (Signed)
Pt comes into the ED via POV c/o perirectal abscess.  Patient has a h/o of them in the past where he has had to have them surgically opened.  Patient states he just noticed it today.  Denies any fevers at home and it is uncomfortable but nowhere near the amount of pain that came with the first one.  Patient see Dr. Burt Knack for the previous ones and he called his office today and they sent him him to see about antibiotics first.  Patient in NAD at this time with even and unlabored respirations and is ambulatory to triage at this time.
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
## 2017-01-05 ENCOUNTER — Telehealth: Payer: Self-pay | Admitting: Surgery

## 2017-01-05 NOTE — Telephone Encounter (Signed)
Patient states that he spoke with you yesterday about a referral but he would like for you to call him back when you have a chance.

## 2017-01-09 NOTE — Telephone Encounter (Signed)
I have called patient to discuss a referral to GI. No answer. I have left a message on the voicemail.

## 2017-01-10 NOTE — Telephone Encounter (Signed)
I have referred patient to GI per ED note to [facility] for further management of ongoing diverticulosis and possible colonoscopy . Patient has called and requested this.

## 2017-02-17 ENCOUNTER — Other Ambulatory Visit: Payer: Self-pay

## 2017-02-17 ENCOUNTER — Telehealth: Payer: Self-pay | Admitting: Gastroenterology

## 2017-02-17 DIAGNOSIS — Z1211 Encounter for screening for malignant neoplasm of colon: Secondary | ICD-10-CM

## 2017-02-17 MED ORDER — NA SULFATE-K SULFATE-MG SULF 17.5-3.13-1.6 GM/177ML PO SOLN: 1.0000 | ORAL | 0 refills | Status: DC

## 2017-02-17 NOTE — Telephone Encounter (Signed)
Gastroenterology Pre-Procedure Review  Request Date:   Requesting Physician: Dr.    PATIENT REVIEW QUESTIONS: The patient responded to the following health history questions as indicated:    1. Are you having any GI issues? yes (abdominal pain) 2. Do you have a personal history of Polyps? no 3. Do you have a family history of Colon Cancer or Polyps? no 4. Diabetes Mellitus? no 5. Joint replacements in the past 12 months?no 6. Major health problems in the past 3 months?yes (perirectal abscess) 7. Any artificial heart valves, MVP, or defibrillator?no    MEDICATIONS & ALLERGIES:    Patient reports the following regarding taking any anticoagulation/antiplatelet therapy:   Plavix, Coumadin, Eliquis, Xarelto, Lovenox, Pradaxa, Brilinta, or Effient? no Aspirin? no  Patient confirms/reports the following medications:  Current Outpatient Medications  Medication Sig Dispense Refill   ALPRAZolam (XANAX) 1 MG tablet Take by mouth.     ALPRAZolam (XANAX) 1 MG tablet Take 1 mg by mouth 4 (four) times daily.  5   aspirin EC 81 MG tablet Take by mouth.     buPROPion (WELLBUTRIN XL) 150 MG 24 hr tablet Take 450 mg by mouth every morning.  12   celecoxib (CELEBREX) 400 MG capsule Take 400 mg by mouth daily.     cyclobenzaprine (FLEXERIL) 5 MG tablet Take by mouth.     FLUoxetine (PROZAC) 40 MG capsule Take by mouth daily.  12   modafinil (PROVIGIL) 200 MG tablet Take 400 mg by mouth.     modafinil (PROVIGIL) 200 MG tablet Take 200 mg by mouth 2 (two) times daily.  5   nystatin cream (MYCOSTATIN) Apply topically.     Oxcarbazepine (TRILEPTAL) 300 MG tablet Take by mouth.     pentoxifylline (TRENTAL) 400 MG CR tablet Take by mouth.     sulfamethoxazole-trimethoprim (BACTRIM DS,SEPTRA DS) 800-160 MG tablet Take 1 tablet by mouth 2 (two) times daily. 14 tablet 0   SUMAtriptan (IMITREX) 100 MG tablet TAKE 1 TABLET BY MOUTH ONCE FOR MIGRAINE, MAY TAKE 2ND DOSE AFTER 2 HOURS IF SYMPTOMS  PERSIST     testosterone cypionate (DEPOTESTOSTERONE CYPIONATE) 200 MG/ML injection INJECT 1 ML INTO THE MUSCLE ONCE EVERY 7 DAYS  1   No current facility-administered medications for this visit.     Patient confirms/reports the following allergies:  Allergies  Allergen Reactions   Codeine Nausea And Vomiting   Morphine And Related Itching   Buprenorphine Hcl Itching    No orders of the defined types were placed in this encounter.   AUTHORIZATION INFORMATION Primary Insurance: 1D#: Group #:  Secondary Insurance: 1D#: Group #:  SCHEDULE INFORMATION: Date:  Time: Location:

## 2017-02-22 ENCOUNTER — Encounter: Payer: Self-pay | Admitting: *Deleted

## 2017-02-22 ENCOUNTER — Other Ambulatory Visit: Payer: Self-pay

## 2017-02-24 NOTE — Discharge Instructions (Signed)
General Anesthesia, Adult, Care After °These instructions provide you with information about caring for yourself after your procedure. Your health care provider may also give you more specific instructions. Your treatment has been planned according to current medical practices, but problems sometimes occur. Call your health care provider if you have any problems or questions after your procedure. °What can I expect after the procedure? °After the procedure, it is common to have: °· Vomiting. °· A sore throat. °· Mental slowness. ° °It is common to feel: °· Nauseous. °· Cold or shivery. °· Sleepy. °· Tired. °· Sore or achy, even in parts of your body where you did not have surgery. ° °Follow these instructions at home: °For at least 24 hours after the procedure: °· Do not: °? Participate in activities where you could fall or become injured. °? Drive. °? Use heavy machinery. °? Drink alcohol. °? Take sleeping pills or medicines that cause drowsiness. °? Make important decisions or sign legal documents. °? Take care of children on your own. °· Rest. °Eating and drinking °· If you vomit, drink water, juice, or soup when you can drink without vomiting. °· Drink enough fluid to keep your urine clear or pale yellow. °· Make sure you have little or no nausea before eating solid foods. °· Follow the diet recommended by your health care provider. °General instructions °· Have a responsible adult stay with you until you are awake and alert. °· Return to your normal activities as told by your health care provider. Ask your health care provider what activities are safe for you. °· Take over-the-counter and prescription medicines only as told by your health care provider. °· If you smoke, do not smoke without supervision. °· Keep all follow-up visits as told by your health care provider. This is important. °Contact a health care provider if: °· You continue to have nausea or vomiting at home, and medicines are not helpful. °· You  cannot drink fluids or start eating again. °· You cannot urinate after 8-12 hours. °· You develop a skin rash. °· You have fever. °· You have increasing redness at the site of your procedure. °Get help right away if: °· You have difficulty breathing. °· You have chest pain. °· You have unexpected bleeding. °· You feel that you are having a life-threatening or urgent problem. °This information is not intended to replace advice given to you by your health care provider. Make sure you discuss any questions you have with your health care provider. °Document Released: 05/02/2000 Document Revised: 06/29/2015 Document Reviewed: 01/08/2015 °Elsevier Interactive Patient Education © 2018 [employer] ° °

## 2017-02-27 ENCOUNTER — Ambulatory Visit
Admission: RE | Admit: 2017-02-27 | Discharge: 2017-02-27 | Disposition: A | Discharge status: Home / Self Care | Payer: BC Managed Care – PPO | Source: Ambulatory Visit | Attending: Gastroenterology | Admitting: Gastroenterology

## 2017-02-27 ENCOUNTER — Ambulatory Visit: Payer: BC Managed Care – PPO | Admitting: Anesthesiology

## 2017-02-27 ENCOUNTER — Encounter
Admission: RE | Disposition: A | Discharge status: Home / Self Care | Payer: Self-pay | Source: Ambulatory Visit | Attending: Gastroenterology

## 2017-02-27 DIAGNOSIS — I10 Essential (primary) hypertension: Secondary | ICD-10-CM | POA: Diagnosis not present

## 2017-02-27 DIAGNOSIS — K2100 Gastro-esophageal reflux disease with esophagitis, without bleeding: Secondary | ICD-10-CM

## 2017-02-27 DIAGNOSIS — D122 Benign neoplasm of ascending colon: Secondary | ICD-10-CM | POA: Insufficient documentation

## 2017-02-27 DIAGNOSIS — K5730 Diverticulosis of large intestine without perforation or abscess without bleeding: Secondary | ICD-10-CM | POA: Insufficient documentation

## 2017-02-27 DIAGNOSIS — Z8673 Personal history of transient ischemic attack (TIA), and cerebral infarction without residual deficits: Secondary | ICD-10-CM | POA: Diagnosis not present

## 2017-02-27 DIAGNOSIS — R1012 Left upper quadrant pain: Secondary | ICD-10-CM | POA: Diagnosis not present

## 2017-02-27 DIAGNOSIS — G4730 Sleep apnea, unspecified: Secondary | ICD-10-CM | POA: Insufficient documentation

## 2017-02-27 DIAGNOSIS — Z79899 Other long term (current) drug therapy: Secondary | ICD-10-CM | POA: Insufficient documentation

## 2017-02-27 DIAGNOSIS — K449 Diaphragmatic hernia without obstruction or gangrene: Secondary | ICD-10-CM | POA: Diagnosis not present

## 2017-02-27 DIAGNOSIS — Z1211 Encounter for screening for malignant neoplasm of colon: Secondary | ICD-10-CM | POA: Diagnosis not present

## 2017-02-27 HISTORY — DX: Sleep apnea, unspecified: G47.30

## 2017-02-27 HISTORY — PX: POLYPECTOMY: SHX149

## 2017-02-27 HISTORY — PX: COLONOSCOPY WITH PROPOFOL: SHX5780

## 2017-02-27 HISTORY — PX: ESOPHAGOGASTRODUODENOSCOPY (EGD) WITH PROPOFOL: SHX5813

## 2017-02-27 SURGERY — COLONOSCOPY WITH PROPOFOL
Anesthesia: General | Wound class: Contaminated

## 2017-02-27 MED ORDER — SODIUM CHLORIDE 0.9 % IV SOLN: INTRAVENOUS | Status: DC

## 2017-02-27 MED ORDER — PANTOPRAZOLE SODIUM 40 MG PO TBEC: 40.0000 mg | DELAYED_RELEASE_TABLET | Freq: Every day | ORAL | 11 refills | Status: DC

## 2017-02-27 MED ORDER — PROPOFOL 10 MG/ML IV BOLUS
INTRAVENOUS | Status: DC | PRN
  Administered 2017-02-27 (×3): 20 mg via INTRAVENOUS
  Administered 2017-02-27: 100 mg via INTRAVENOUS
  Administered 2017-02-27 (×5): 20 mg via INTRAVENOUS

## 2017-02-27 MED ORDER — LACTATED RINGERS IV SOLN
INTRAVENOUS | Status: DC
  Administered 2017-02-27: 11:00:00 via INTRAVENOUS

## 2017-02-27 MED ORDER — GLYCOPYRROLATE 0.2 MG/ML IJ SOLN
INTRAMUSCULAR | Status: DC | PRN
  Administered 2017-02-27: 0.2 mg via INTRAVENOUS

## 2017-02-27 MED ORDER — LIDOCAINE HCL (CARDIAC) 20 MG/ML IV SOLN
INTRAVENOUS | Status: DC | PRN
  Administered 2017-02-27: 50 mg via INTRAVENOUS

## 2017-02-27 SURGICAL SUPPLY — 36 items
BALLN DILATOR 10-12 8 (BALLOONS)
BALLN DILATOR 12-15 8 (BALLOONS)
BALLN DILATOR 15-18 8 (BALLOONS)
BALLN DILATOR CRE 0-12 8 (BALLOONS)
BALLN DILATOR ESOPH 8 10 CRE (MISCELLANEOUS) IMPLANT
BALLOON DILATOR 12-15 8 (BALLOONS) IMPLANT
BALLOON DILATOR 15-18 8 (BALLOONS) IMPLANT
BALLOON DILATOR CRE 0-12 8 (BALLOONS) IMPLANT
BLOCK BITE 60FR ADLT L/F GRN (MISCELLANEOUS) ×3 IMPLANT
CANISTER SUCT 1200ML W/VALVE (MISCELLANEOUS) ×3 IMPLANT
CLIP HMST 235XBRD CATH ROT (MISCELLANEOUS) IMPLANT
CLIP RESOLUTION 360 11X235 (MISCELLANEOUS)
ELECT REM PT RETURN 9FT ADLT (ELECTROSURGICAL)
ELECTRODE REM PT RTRN 9FT ADLT (ELECTROSURGICAL) IMPLANT
FCP ESCP3.2XJMB 240X2.8X (MISCELLANEOUS)
FORCEPS BIOP RAD 4 LRG CAP 4 (CUTTING FORCEPS) IMPLANT
FORCEPS BIOP RJ4 240 W/NDL (MISCELLANEOUS)
FORCEPS ESCP3.2XJMB 240X2.8X (MISCELLANEOUS) IMPLANT
GOWN CVR UNV OPN BCK APRN NK (MISCELLANEOUS) ×4 IMPLANT
GOWN ISOL THUMB LOOP REG UNIV (MISCELLANEOUS) ×6
INJECTOR VARIJECT VIN23 (MISCELLANEOUS) IMPLANT
KIT DEFENDO VALVE AND CONN (KITS) IMPLANT
KIT ENDO PROCEDURE OLY (KITS) ×3 IMPLANT
MARKER SPOT ENDO TATTOO 5ML (MISCELLANEOUS) IMPLANT
PROBE APC STR FIRE (PROBE) IMPLANT
RETRIEVER NET PLAT FOOD (MISCELLANEOUS) IMPLANT
RETRIEVER NET ROTH 2.5X230 LF (MISCELLANEOUS) IMPLANT
SNARE SHORT THROW 13M SML OVAL (MISCELLANEOUS) ×1 IMPLANT
SNARE SHORT THROW 30M LRG OVAL (MISCELLANEOUS) IMPLANT
SNARE SNG USE RND 15MM (INSTRUMENTS) IMPLANT
SPOT EX ENDOSCOPIC TATTOO (MISCELLANEOUS)
SYR INFLATION 60ML (SYRINGE) IMPLANT
TRAP ETRAP POLY (MISCELLANEOUS) ×1 IMPLANT
VARIJECT INJECTOR VIN23 (MISCELLANEOUS)
WATER STERILE IRR 250ML POUR (IV SOLUTION) ×3 IMPLANT
WIRE CRE 18-20MM 8CM F G (MISCELLANEOUS) IMPLANT

## 2017-02-27 NOTE — Anesthesia Postprocedure Evaluation (Signed)
Anesthesia Post Note  Patient: Christopher Chen  Procedure(s) Performed: COLONOSCOPY WITH PROPOFOL (N/A ) ESOPHAGOGASTRODUODENOSCOPY (EGD) WITH PROPOFOL (N/A ) POLYPECTOMY INTESTINAL  Patient location during evaluation: PACU Anesthesia Type: General Level of consciousness: awake and alert Pain management: pain level controlled Vital Signs Assessment: post-procedure vital signs reviewed and stable Respiratory status: spontaneous breathing Cardiovascular status: blood pressure returned to baseline Postop Assessment: no headache Anesthetic complications: no    Jaci Standard, III,  Jona Zappone D

## 2017-02-27 NOTE — Op Note (Signed)
[facility] Gastroenterology Patient Name: Christopher Chen Procedure Date: 02/27/2017 11:47 AM MRN: [id number] Account #: [id number] Date of Birth: [birth year] Admit Type: Outpatient Age: 47 Room: [facility] OR ROOM 01 Gender: Male Note Status: Finalized Procedure:            Colonoscopy Indications:          Screening for colorectal malignant neoplasm Providers:            Lucilla Lame MD, MD Medicines:            Propofol per Anesthesia Complications:        No immediate complications. Procedure:            Pre-Anesthesia Assessment:                       - Prior to the procedure, a History and Physical was                        performed, and patient medications and allergies were                        reviewed. The patient's tolerance of previous                        anesthesia was also reviewed. The risks and benefits of                        the procedure and the sedation options and risks were                        discussed with the patient. All questions were                        answered, and informed consent was obtained. Prior                        Anticoagulants: The patient has taken no previous                        anticoagulant or antiplatelet agents. ASA Grade                        Assessment: II - A patient with mild systemic disease.                        After reviewing the risks and benefits, the patient was                        deemed in satisfactory condition to undergo the                        procedure.                       After obtaining informed consent, the colonoscope was                        passed under direct vision. Throughout the procedure,                        the patient's blood pressure,  pulse, and oxygen                        saturations were monitored continuously. The Olympus CF                        H180AL Colonoscope (S#: [id number]) was introduced through                        the anus and advanced to the the  cecum, identified by                        appendiceal orifice and ileocecal valve. The                        colonoscopy was performed without difficulty. The                        patient tolerated the procedure well. The quality of                        the bowel preparation was excellent. Findings:      The perianal and digital rectal examinations were normal.      A 7 mm polyp was found in the ascending colon. The polyp was sessile.       The polyp was removed with a cold snare. Resection and retrieval were       complete.      Multiple small-mouthed diverticula were found in the sigmoid colon. Impression:           - One 7 mm polyp in the ascending colon, removed with a                        cold snare. Resected and retrieved.                       - Diverticulosis in the sigmoid colon. Recommendation:       - Discharge patient to home.                       - Resume previous diet.                       - Continue present medications.                       - Await pathology results.                       - Repeat colonoscopy in 5 years if polyp adenoma and 10                        years if hyperplastic Procedure Code(s):    --- Professional ---                       [phone], Colonoscopy, flexible; with removal of tumor(s),                        polyp(s), or other lesion(s) by snare technique Diagnosis Code(s):    --- Professional ---  Z12.11, Encounter for screening for malignant neoplasm                        of colon                       D12.2, Benign neoplasm of ascending colon CPT copyright 2016 American Medical Association. All rights reserved. The codes documented in this report are preliminary and upon coder review may  be revised to meet current compliance requirements. Lucilla Lame MD, MD 02/27/2017 12:26:36 PM This report has been signed electronically. Number of Addenda: 0 Note Initiated On: 02/27/2017 11:47 AM Scope Withdrawal Time: 0  hours 7 minutes 27 seconds  Total Procedure Duration: 0 hours 10 minutes 49 seconds       [facility]

## 2017-02-27 NOTE — Op Note (Signed)
[facility] Gastroenterology Patient Name: Christopher Chen Procedure Date: 02/27/2017 11:48 AM MRN: [id number] Account #: [id number] Date of Birth: February 13, 1970 Admit Type: Outpatient Age: 47 Room: [facility] OR ROOM 01 Gender: Male Note Status: Finalized Procedure:            Upper GI endoscopy Indications:          Abdominal pain in the left upper quadrant Providers:            Lucilla Lame MD, MD Referring MD:         Sofie Hartigan (Referring MD) Medicines:            Propofol per Anesthesia Complications:        No immediate complications. Procedure:            Pre-Anesthesia Assessment:                       - Prior to the procedure, a History and Physical was                        performed, and patient medications and allergies were                        reviewed. The patient's tolerance of previous                        anesthesia was also reviewed. The risks and benefits of                        the procedure and the sedation options and risks were                        discussed with the patient. All questions were                        answered, and informed consent was obtained. Prior                        Anticoagulants: The patient has taken no previous                        anticoagulant or antiplatelet agents. ASA Grade                        Assessment: II - A patient with mild systemic disease.                        After reviewing the risks and benefits, the patient was                        deemed in satisfactory condition to undergo the                        procedure.                       After obtaining informed consent, the endoscope was                        passed under direct vision. Throughout the procedure,  the patient's blood pressure, pulse, and oxygen                        saturations were monitored continuously. The Olympus                        [id number]-HQ190 Endoscope (S#. [id number]) was introduced                  through the mouth, and advanced to the second part of                        duodenum. The upper GI endoscopy was accomplished                        without difficulty. The patient tolerated the procedure                        well. Findings:      LA Grade C (one or more mucosal breaks continuous between tops of 2 or       more mucosal folds, less than 75% circumference) esophagitis with no       bleeding was found in the distal esophagus.      The stomach was normal.      The examined duodenum was normal.      A small hiatal hernia was present. Impression:           - LA Grade C reflux esophagitis.                       - Normal stomach.                       - Normal examined duodenum.                       - Small hiatal hernia.                       - No specimens collected. Recommendation:       - Discharge patient to home.                       - Resume previous diet.                       - Continue present medications.                       - No ibuprofen, naproxen, or other non-steroidal                        anti-inflammatory drugs. Procedure Code(s):    --- Professional ---                       [phone], Esophagogastroduodenoscopy, flexible, transoral;                        diagnostic, including collection of specimen(s) by                        brushing or washing, when performed (separate procedure) Diagnosis Code(s):    --- Professional ---  R10.12, Left upper quadrant pain                       K21.0, Gastro-esophageal reflux disease with esophagitis CPT copyright 2016 American Medical Association. All rights reserved. The codes documented in this report are preliminary and upon coder review may  be revised to meet current compliance requirements. Lucilla Lame MD, MD 02/27/2017 12:12:01 PM This report has been signed electronically. Number of Addenda: 0 Note Initiated On: 02/27/2017 11:48 AM Total Procedure Duration: 0 hours 1  minute 5 seconds       [facility]

## 2017-02-27 NOTE — Transfer of Care (Signed)
Immediate Anesthesia Transfer of Care Note  Patient: Christopher Chen  Procedure(s) Performed: COLONOSCOPY WITH PROPOFOL (N/A ) ESOPHAGOGASTRODUODENOSCOPY (EGD) WITH PROPOFOL (N/A ) POLYPECTOMY INTESTINAL  Patient Location: PACU  Anesthesia Type: General  Level of Consciousness: awake, alert  and patient cooperative  Airway and Oxygen Therapy: Patient Spontanous Breathing and Patient connected to supplemental oxygen  Post-op Assessment: Post-op Vital signs reviewed, Patient's Cardiovascular Status Stable, Respiratory Function Stable, Patent Airway and No signs of Nausea or vomiting  Post-op Vital Signs: Reviewed and stable  Complications: No apparent anesthesia complications

## 2017-02-27 NOTE — Anesthesia Preprocedure Evaluation (Addendum)
Anesthesia Evaluation   Patient awake    Reviewed: Allergy & Precautions, H&P , NPO status , Patient's Chart, lab work & pertinent test results  Airway Mallampati: II  TM Distance: >3 FB Neck ROM: full    Dental no notable dental hx.    Pulmonary sleep apnea ,    Pulmonary exam normal        Cardiovascular hypertension, On Medications Normal cardiovascular exam     Neuro/Psych    GI/Hepatic negative GI ROS, Neg liver ROS,   Endo/Other  negative endocrine ROS  Renal/GU negative Renal ROS     Musculoskeletal   Abdominal   Peds  Hematology negative hematology ROS (+)   Anesthesia Other Findings   Reproductive/Obstetrics negative OB ROS                            Anesthesia Physical Anesthesia Plan  ASA: II  Anesthesia Plan: General   Post-op Pain Management:    Induction:   PONV Risk Score and Plan:   Airway Management Planned:   Additional Equipment:   Intra-op Plan:   Post-operative Plan:   Informed Consent: I have reviewed the patients History and Physical, chart, labs and discussed the procedure including the risks, benefits and alternatives for the proposed anesthesia with the patient or authorized representative who has indicated his/her understanding and acceptance.     Plan Discussed with:   Anesthesia Plan Comments:         Anesthesia Quick Evaluation

## 2017-02-27 NOTE — Anesthesia Procedure Notes (Signed)
Performed by: Raea Magallon, CRNA Pre-anesthesia Checklist: Patient identified, Emergency Drugs available, Suction available, Timeout performed and Patient being monitored Patient Re-evaluated:Patient Re-evaluated prior to induction Oxygen Delivery Method: Nasal cannula Placement Confirmation: positive ETCO2       
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
## 2017-03-01 ENCOUNTER — Encounter: Payer: Self-pay | Admitting: Gastroenterology

## 2017-03-02 ENCOUNTER — Encounter: Payer: Self-pay | Admitting: Gastroenterology

## 2017-06-25 IMAGING — MR MR HEAD WO/W CM
12 series · 48 of 48 positions shown · IV contrast (multihance)
Comparison: Head CT 01/12/2015

CLINICAL DATA: Headache with blurred vision for 4 days.

EXAM:
MRI HEAD WITHOUT AND WITH CONTRAST
TECHNIQUE: Multiplanar, multiecho pulse sequences of the brain and surrounding
structures were obtained without and with intravenous contrast.
CONTRAST:  20mL MULTIHANCE GADOBENATE DIMEGLUMINE 529 MG/ML IV SOLN

[Series 2: T1 · sagittal · 5.0mm · 0.45mm/px · 2 of 30 slices shown (1 of 2)]
[im 1/30]
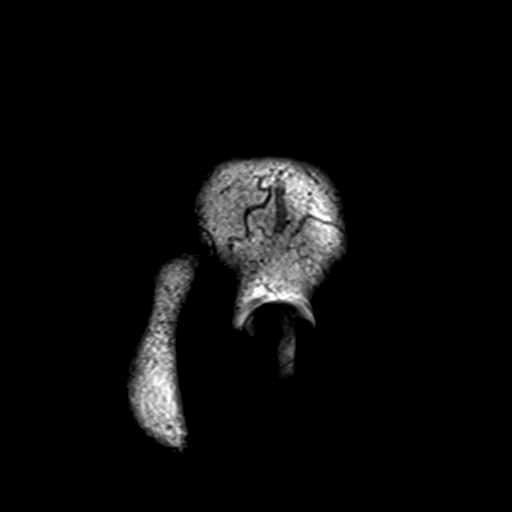
[im 30/30]
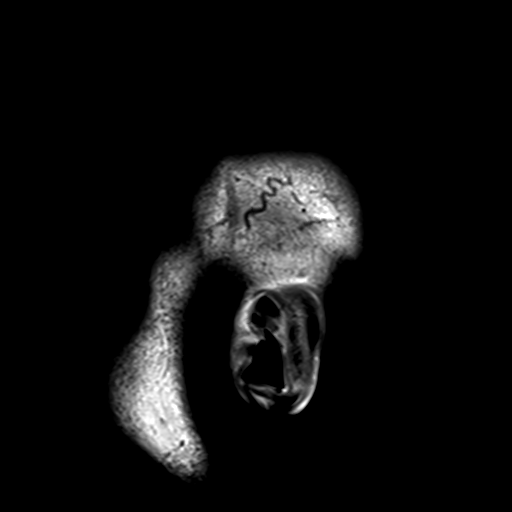

[Series 4: DWI · axial · 3.0mm · 1.80mm/px · z∈[-76,+91]mm · 5 of 53 slices shown (1 of 4)]
[im 1/53]
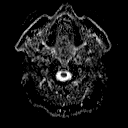
[im 14/53]
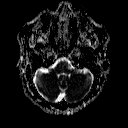
[im 27/53]
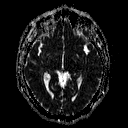
[im 40/53]
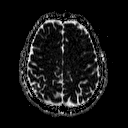
[im 53/53]
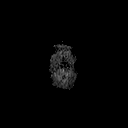

[Series 6: DWI · coronal · 3.0mm · 1.80mm/px · 5 of 52 slices shown (2 of 4)]
[im 1/52]
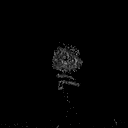
[im 13/52]
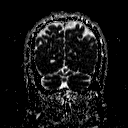
[im 26/52]
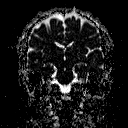
[im 39/52]
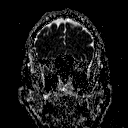
[im 52/52]
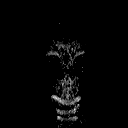

[Series 7: T2 · axial · 5.0mm · 0.60mm/px · z∈[-70,+91]mm · 3 of 26 slices shown (1 of 2)]
[im 1/26]
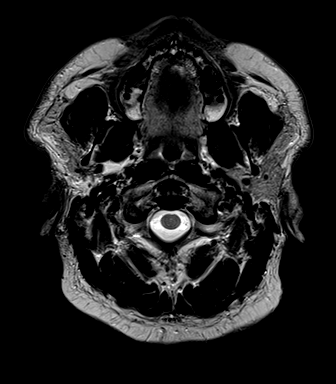
[im 13/26]
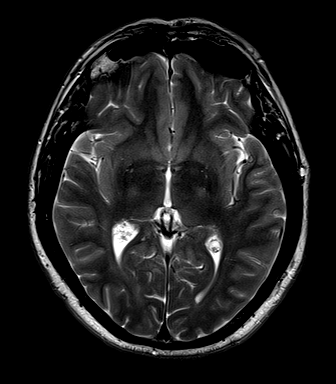
[im 26/26]
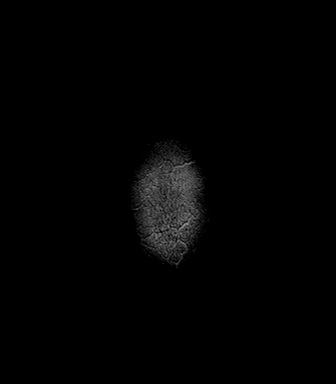

[Series 8: FLAIR · axial · 5.0mm · 0.45mm/px · z∈[-70,+91]mm · 3 of 26 slices shown]
[im 1/26]
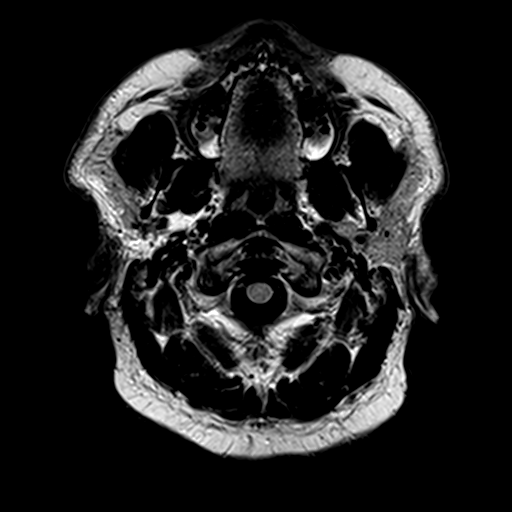
[im 13/26]
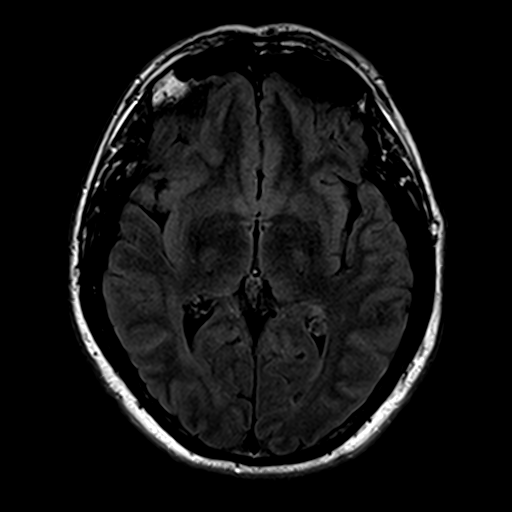
[im 26/26]
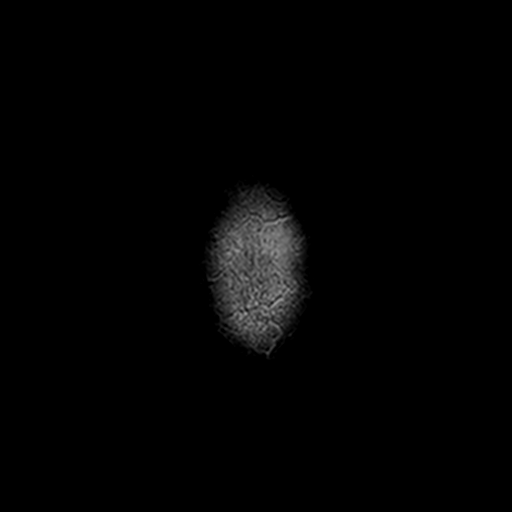

[Series 9: T2 · axial · 5.0mm · 0.45mm/px · z∈[-70,+91]mm · 3 of 26 slices shown (2 of 2)]
[im 1/26]
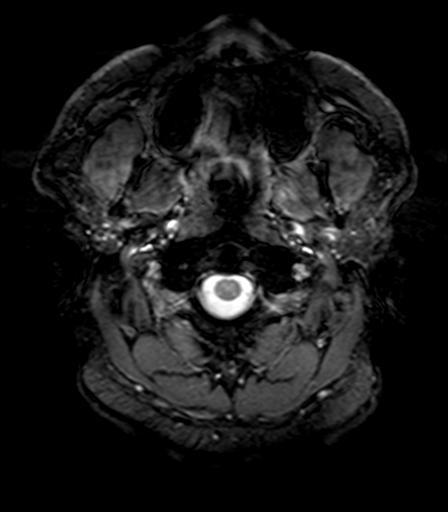
[im 13/26]
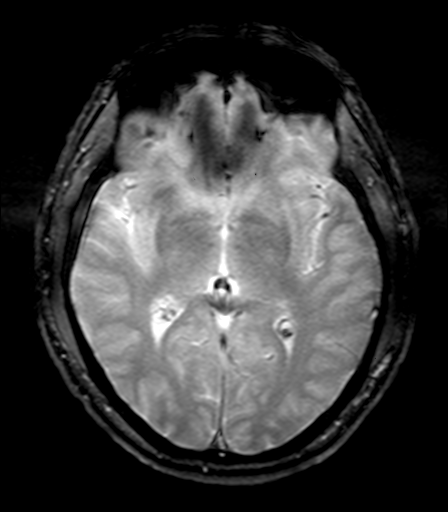
[im 26/26]
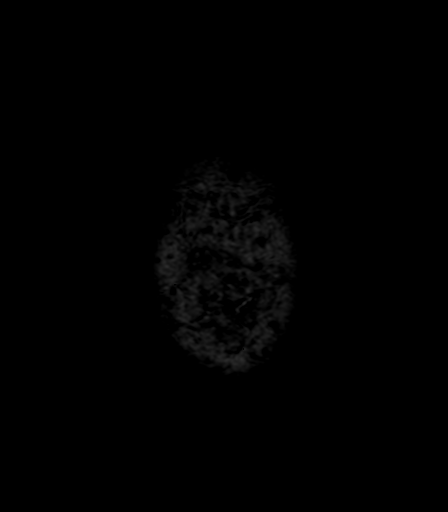

[Series 10: T1 · axial · 3.0mm · 1.00mm/px · z∈[-71,+93]mm · 5 of 56 slices shown (2 of 2)]
[im 1/56]
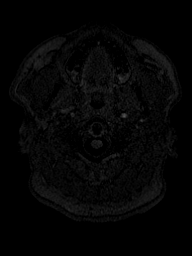
[im 14/56]
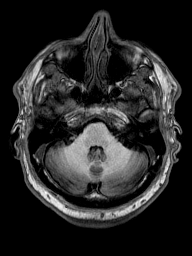
[im 28/56]
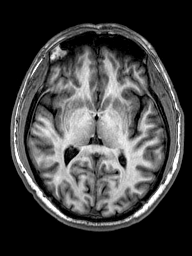
[im 42/56]
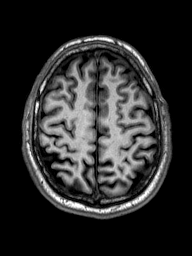
[im 56/56]
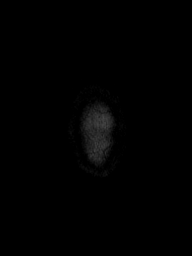

[Series 11: T2 post-contrast · coronal · 5.0mm · 0.49mm/px · 3 of 31 slices shown]
[im 1/31]
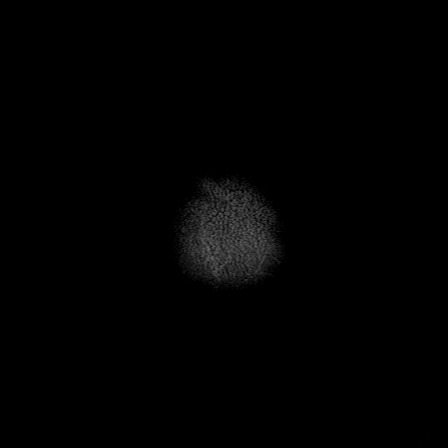
[im 16/31]
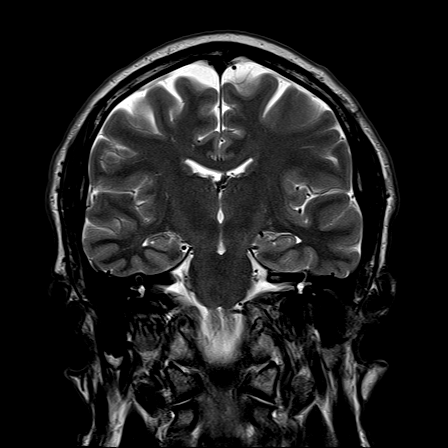
[im 31/31]
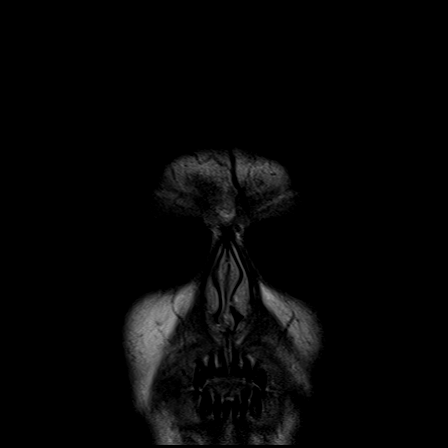

[Series 12: T1 post-contrast · axial · 3.0mm · 1.00mm/px · z∈[-71,+93]mm · 5 of 56 slices shown (1 of 2)]
[im 1/56]
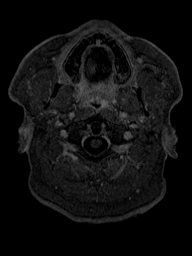
[im 14/56]
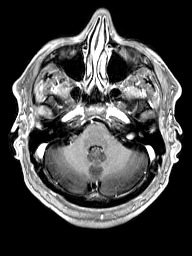
[im 28/56]
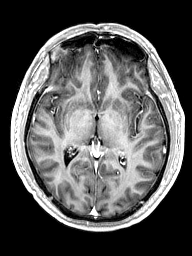
[im 42/56]
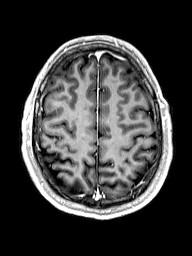
[im 56/56]
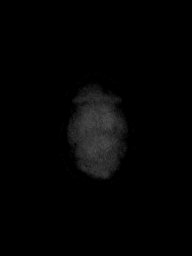

[Series 13: T1 post-contrast · coronal · 5.0mm · 0.43mm/px · 3 of 31 slices shown (2 of 2)]
[im 1/31]
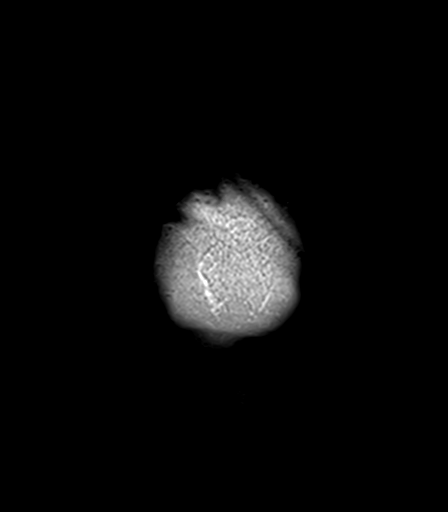
[im 16/31]
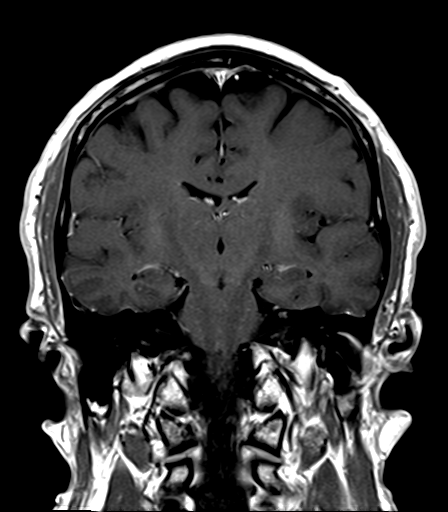
[im 31/31]
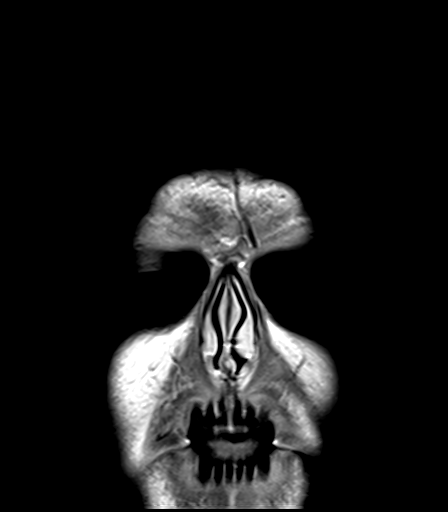

[Series 100: DWI · axial · 3.0mm · 1.80mm/px · z∈[-76,+91]mm · 6 of 57 slices shown (3 of 4)]
[im 1/57]
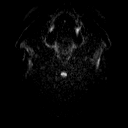
[im 12/57]
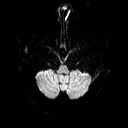
[im 23/57]
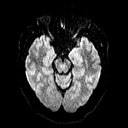
[im 34/57]
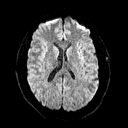
[im 45/57]
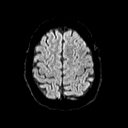
[im 57/57]
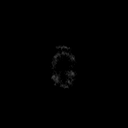

[Series 101: DWI · coronal · 3.0mm · 1.80mm/px · 5 of 52 slices shown (4 of 4)]
[im 1/52]
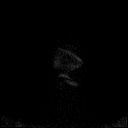
[im 13/52]
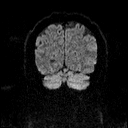
[im 26/52]
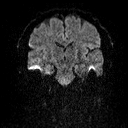
[im 39/52]
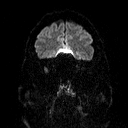
[im 52/52]
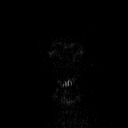

[48 of 48 positions shown; findings below may reference images not displayed]

FINDINGS: There is no evidence of acute infarct, mass, midline shift, or
extra-axial fluid collection. There is a small region of
transcortical encephalomalacia in the right parietal lobe
corresponding to the abnormality on CT. There is a small amount of
associated chronic blood products or mineralization. The brain is
otherwise normal in appearance. No abnormal enhancement is
identified. There is no hydrocephalus.

Orbits are unremarkable. Paranasal sinuses and mastoid air cells are
clear. Major intracranial vascular flow voids are preserved.
IMPRESSION: 1. No acute intracranial abnormality.
2. Small region of chronic encephalomalacia in the right parietal
lobe.

## 2017-09-04 ENCOUNTER — Telehealth: Payer: Self-pay | Admitting: Surgery

## 2017-09-04 NOTE — Telephone Encounter (Signed)
Patients coming in tomorrow.

## 2017-09-04 NOTE — Telephone Encounter (Signed)
Patient is calling said he saw Dr. Burt Knack over a year ago , when he sits down it and puts pressure on his area it hurts a little thinks he may have another abscess. Patient has has had a UTI for a couple months patient said he went to the walk-in clinic and recieved antibiotics but it keeps happening. Please call patient and advise.

## 2017-09-04 NOTE — Telephone Encounter (Signed)
Please schedule a follow up appointment with Dr. Burt Knack. Thank you.

## 2017-09-05 ENCOUNTER — Other Ambulatory Visit
Admission: RE | Admit: 2017-09-05 | Discharge: 2017-09-05 | Disposition: A | Discharge status: Home / Self Care | Payer: BC Managed Care – PPO | Source: Ambulatory Visit | Attending: Surgery | Admitting: Surgery

## 2017-09-05 ENCOUNTER — Encounter: Payer: Self-pay | Admitting: Surgery

## 2017-09-05 ENCOUNTER — Ambulatory Visit (INDEPENDENT_AMBULATORY_CARE_PROVIDER_SITE_OTHER): Payer: BC Managed Care – PPO | Admitting: Surgery

## 2017-09-05 ENCOUNTER — Telehealth: Payer: Self-pay

## 2017-09-05 VITALS — Ht 75.0 in | Wt 220.4 lb

## 2017-09-05 DIAGNOSIS — B961 Klebsiella pneumoniae [K. pneumoniae] as the cause of diseases classified elsewhere: Secondary | ICD-10-CM | POA: Diagnosis not present

## 2017-09-05 DIAGNOSIS — K611 Rectal abscess: Secondary | ICD-10-CM

## 2017-09-05 DIAGNOSIS — R350 Frequency of micturition: Secondary | ICD-10-CM

## 2017-09-05 LAB — URINALYSIS, ROUTINE W REFLEX MICROSCOPIC
Bilirubin Urine: NEGATIVE
Glucose, UA: NEGATIVE mg/dL
Hgb urine dipstick: NEGATIVE
KETONES UR: NEGATIVE mg/dL
Nitrite: POSITIVE — AB
Protein, ur: NEGATIVE mg/dL
SQUAMOUS EPITHELIAL / LPF: NONE SEEN (ref 0–5)
Specific Gravity, Urine: 1.012 (ref 1.005–1.030)
pH: 7 (ref 5.0–8.0)

## 2017-09-05 MED ORDER — CIPROFLOXACIN HCL 500 MG PO TABS: 500.0000 mg | ORAL_TABLET | Freq: Two times a day (BID) | ORAL | 0 refills | Status: AC

## 2017-09-05 NOTE — Patient Instructions (Signed)
Please stop by the lab today to leave a urine specimen.  Please pick up your medicine at the pharmacy today.  Please see your follow up appointment listed below.

## 2017-09-05 NOTE — Telephone Encounter (Signed)
Patient notified of Urine results. Be sure to take Antibiotic that is prescribed and we will see you on Thursday.
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
## 2017-09-07 ENCOUNTER — Telehealth: Payer: Self-pay

## 2017-09-07 ENCOUNTER — Ambulatory Visit: Payer: Self-pay | Admitting: Surgery

## 2017-09-07 ENCOUNTER — Telehealth: Payer: Self-pay | Admitting: Surgery

## 2017-09-07 LAB — URINE CULTURE: Culture: >=100000 — AB

## 2017-09-07 NOTE — Telephone Encounter (Signed)
Left message for patient to return call regarding urine culture. Patient to continue taking Cipro until finished and drink plenty of water.

## 2017-09-07 NOTE — Telephone Encounter (Signed)
Spoke with patient and let him know to take his medication due to culture.

## 2017-09-07 NOTE — Telephone Encounter (Signed)
Patient is calling returning VM.

## 2018-03-11 ENCOUNTER — Other Ambulatory Visit: Payer: Self-pay | Admitting: Gastroenterology

## 2018-06-03 ENCOUNTER — Other Ambulatory Visit: Payer: Self-pay | Admitting: Gastroenterology

## 2018-11-15 DIAGNOSIS — M19012 Primary osteoarthritis, left shoulder: Secondary | ICD-10-CM | POA: Insufficient documentation

## 2019-01-22 ENCOUNTER — Telehealth: Payer: Self-pay | Admitting: Gastroenterology

## 2019-01-22 NOTE — Telephone Encounter (Signed)
Patient has an appointment on 03-12-18 in [place] with Dr Allen Norris. He needs a refill on   pantoprazole (PROTONIX) 40 MG tablet   Called into the [facility] in [place].

## 2019-01-23 ENCOUNTER — Other Ambulatory Visit: Payer: Self-pay

## 2019-01-23 MED ORDER — PANTOPRAZOLE SODIUM 40 MG PO TBEC: 40.0000 mg | DELAYED_RELEASE_TABLET | Freq: Every day | ORAL | 2 refills | Status: DC

## 2019-01-23 NOTE — Telephone Encounter (Signed)
Refill for Pantoprazole has been sent to pt's pharmacy.

## 2019-02-20 ENCOUNTER — Other Ambulatory Visit: Payer: Self-pay | Admitting: Gastroenterology

## 2019-03-08 ENCOUNTER — Other Ambulatory Visit: Payer: Self-pay

## 2019-03-12 ENCOUNTER — Other Ambulatory Visit: Payer: Self-pay

## 2019-03-13 ENCOUNTER — Encounter: Payer: Self-pay | Admitting: Gastroenterology

## 2019-03-13 ENCOUNTER — Ambulatory Visit (INDEPENDENT_AMBULATORY_CARE_PROVIDER_SITE_OTHER): Payer: BC Managed Care – PPO | Admitting: Gastroenterology

## 2019-03-13 ENCOUNTER — Other Ambulatory Visit: Payer: Self-pay

## 2019-03-13 VITALS — BP 135/95 | HR 96 | Temp 96.5°F | Ht 75.0 in | Wt 240.0 lb

## 2019-03-13 DIAGNOSIS — K219 Gastro-esophageal reflux disease without esophagitis: Secondary | ICD-10-CM

## 2019-03-13 MED ORDER — PANTOPRAZOLE SODIUM 40 MG PO TBEC: 40.0000 mg | DELAYED_RELEASE_TABLET | Freq: Every day | ORAL | 11 refills | Status: DC

## 2019-03-13 NOTE — Progress Notes (Signed)
Primary Care Physician: Sofie Hartigan, MD  Primary Gastroenterologist:  Dr. Lucilla Lame  No chief complaint on file.   HPI: Christopher Chen is a 49 y.o. male here for medication refill.  The patient has been doing well as long as he takes the medication.  He reports that sometimes he does so well that he forgets to take the medication and then will realize that he will have a fullness in his throat and will start back on the medication and his symptoms will go away.  The patient has not had any side effects of his pantoprazole.  There is no report of any dysphagia nausea vomiting fevers or chills.  Current Outpatient Medications  Medication Sig Dispense Refill  . ALPRAZolam (XANAX) 1 MG tablet Take 1 mg by mouth 4 (four) times daily. (Pt usually takes 1 in AM)  5  . aspirin EC 81 MG tablet Take by mouth.    Marland Kitchen b complex vitamins capsule Take by mouth.    . B-D 3CC LUER-LOK SYR 22GX1-1/2 22G X 1-1/2" 3 ML MISC USE FOR TESTOSTERONE INJECTION  5  . buPROPion (WELLBUTRIN XL) 150 MG 24 hr tablet Take 150 mg by mouth 3 (three) times daily.   12  . celecoxib (CELEBREX) 400 MG capsule Take 400 mg by mouth daily.    . cyclobenzaprine (FLEXERIL) 5 MG tablet Take by mouth.    Marland Kitchen FLUoxetine (PROZAC) 40 MG capsule Take by mouth daily.  12  . loperamide (IMODIUM A-D) 2 MG tablet Take 2 capsules after first loose stool, then one capsule after each following loose stool. Do not exceed 16 mg per day.    . methocarbamol (ROBAXIN) 500 MG tablet Take 500 mg by mouth 2 (two) times daily as needed.    . modafinil (PROVIGIL) 200 MG tablet Take 400 mg by mouth.    . Multiple Vitamin (MULTIVITAMIN) capsule Take by mouth.    . mupirocin ointment (BACTROBAN) 2 % APPLY TO AFFECTED AREA 3 TIMES A DAY FOR 7 DAYS    . Na Sulfate-K Sulfate-Mg Sulf (SUPREP BOWEL PREP KIT) 17.5-3.13-1.6 GM/177ML SOLN Take 1 kit by mouth as directed. 1 Bottle 0  . ondansetron (ZOFRAN-ODT) 4 MG disintegrating tablet     . Oxcarbazepine  (TRILEPTAL) 300 MG tablet Take by mouth.    . pantoprazole (PROTONIX) 40 MG tablet Take 1 tablet (40 mg total) by mouth daily. 30 tablet 2  . SUMAtriptan (IMITREX) 100 MG tablet TAKE 1 TABLET BY MOUTH ONCE FOR MIGRAINE, MAY TAKE 2ND DOSE AFTER 2 HOURS IF SYMPTOMS PERSIST    . testosterone cypionate (DEPOTESTOSTERONE CYPIONATE) 200 MG/ML injection INJECT 1 ML INTO THE MUSCLE ONCE EVERY 7 DAYS  1  . traMADol (ULTRAM) 50 MG tablet     . valACYclovir (VALTREX) 1000 MG tablet Take 1,000 mg by mouth 3 (three) times daily.     No current facility-administered medications for this visit.    Allergies as of 03/13/2019 - Review Complete 09/05/2017  Allergen Reaction Noted  . Morpholine salicylate Itching 17/51/0258  . Codeine Nausea And Vomiting 01/12/2015  . Morphine and related Itching 01/12/2015  . Buprenorphine hcl Itching 05/07/2015    ROS:  General: Negative for anorexia, weight loss, fever, chills, fatigue, weakness. ENT: Negative for hoarseness, difficulty swallowing , nasal congestion. CV: Negative for chest pain, angina, palpitations, dyspnea on exertion, peripheral edema.  Respiratory: Negative for dyspnea at rest, dyspnea on exertion, cough, sputum, wheezing.  GI: See history of present illness. GU:  Negative for dysuria, hematuria, urinary incontinence, urinary frequency, nocturnal urination.  Endo: Negative for unusual weight change.    Physical Examination:   There were no vitals taken for this visit.  General: Well-nourished, well-developed in no acute distress.  Eyes: No icterus. Conjunctivae pink. Lungs: Clear to auscultation bilaterally. Non-labored. Heart: Regular rate and rhythm, no murmurs rubs or gallops.  Abdomen: Bowel sounds are normal, nontender, nondistended, no hepatosplenomegaly or masses, no abdominal bruits or hernia , no rebound or guarding.   Extremities: No lower extremity edema. No clubbing or deformities. Neuro: Alert and oriented x 3.  Grossly intact.  Skin: Warm and dry, no jaundice.   Psych: Alert and cooperative, normal mood and affect.  Labs:    Imaging Studies: No results found.  Assessment and Plan:   Christopher Chen is a 49 y.o. y/o male who comes in today for refill of his medications.  The patient has had no worry symptoms such as black stools or bloody stools.  He also denies any nausea vomiting or dysphagia.  The patient will have his medication refilled.  The patient has been explained the plan and agrees with it.     Lucilla Lame, MD. [name]    Note: This dictation was prepared with Dragon dictation along with smaller phrase technology. Any transcriptional errors that result from this process are unintentional.

## 2020-03-22 ENCOUNTER — Other Ambulatory Visit: Payer: Self-pay | Admitting: Gastroenterology

## 2020-03-23 ENCOUNTER — Other Ambulatory Visit: Payer: Self-pay

## 2020-03-23 MED ORDER — PANTOPRAZOLE SODIUM 40 MG PO TBEC: 40.0000 mg | DELAYED_RELEASE_TABLET | Freq: Every day | ORAL | 6 refills | Status: DC

## 2021-09-07 ENCOUNTER — Telehealth: Payer: Self-pay | Admitting: Gastroenterology

## 2021-09-07 ENCOUNTER — Other Ambulatory Visit: Payer: Self-pay | Admitting: Gastroenterology

## 2021-09-07 NOTE — Telephone Encounter (Signed)
Patient calling requesting a refill on the prescription pantoprazole 40 mg. I have schedule the patient an appointment.

## 2021-09-09 MED ORDER — PANTOPRAZOLE SODIUM 40 MG PO TBEC: 40.0000 mg | DELAYED_RELEASE_TABLET | Freq: Every day | ORAL | 1 refills | Status: DC

## 2021-09-09 NOTE — Addendum Note (Signed)
Addended by: Lurlean Nanny on: 09/09/2021 08:43 AM   Modules accepted: Orders

## 2021-10-02 ENCOUNTER — Other Ambulatory Visit: Payer: Self-pay | Admitting: Gastroenterology

## 2021-10-18 ENCOUNTER — Ambulatory Visit (INDEPENDENT_AMBULATORY_CARE_PROVIDER_SITE_OTHER): Payer: BC Managed Care – PPO | Admitting: Gastroenterology

## 2021-10-18 VITALS — Temp 98.6°F | Wt 219.0 lb

## 2021-10-18 DIAGNOSIS — K219 Gastro-esophageal reflux disease without esophagitis: Secondary | ICD-10-CM | POA: Diagnosis not present

## 2021-10-18 DIAGNOSIS — Z86010 Personal history of colonic polyps: Secondary | ICD-10-CM

## 2021-10-18 MED ORDER — NA SULFATE-K SULFATE-MG SULF 17.5-3.13-1.6 GM/177ML PO SOLN: 1.0000 | Freq: Once | ORAL | 0 refills | Status: AC

## 2021-10-18 MED ORDER — PANTOPRAZOLE SODIUM 40 MG PO TBEC: 40.0000 mg | DELAYED_RELEASE_TABLET | Freq: Every day | ORAL | 3 refills | Status: DC
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
## 2021-11-09 ENCOUNTER — Other Ambulatory Visit: Payer: Self-pay | Admitting: Gastroenterology

## 2021-11-21 ENCOUNTER — Other Ambulatory Visit: Payer: Self-pay | Admitting: Gastroenterology

## 2022-02-11 NOTE — Addendum Note (Signed)
Addended by: Lurlean Nanny on: 02/11/2022 09:04 AM   Modules accepted: Orders

## 2022-02-16 ENCOUNTER — Encounter: Payer: Self-pay | Admitting: Gastroenterology

## 2022-02-28 ENCOUNTER — Encounter: Payer: Self-pay | Admitting: Gastroenterology

## 2022-02-28 ENCOUNTER — Ambulatory Visit
Admission: RE | Admit: 2022-02-28 | Discharge: 2022-02-28 | Disposition: A | Discharge status: Home / Self Care | Payer: BC Managed Care – PPO | Attending: Gastroenterology | Admitting: Gastroenterology

## 2022-02-28 ENCOUNTER — Ambulatory Visit: Payer: BC Managed Care – PPO | Admitting: Anesthesiology

## 2022-02-28 ENCOUNTER — Other Ambulatory Visit: Payer: Self-pay

## 2022-02-28 ENCOUNTER — Encounter
Admission: RE | Disposition: A | Discharge status: Home / Self Care | Payer: Self-pay | Source: Home / Self Care | Attending: Gastroenterology

## 2022-02-28 DIAGNOSIS — K640 First degree hemorrhoids: Secondary | ICD-10-CM | POA: Diagnosis not present

## 2022-02-28 DIAGNOSIS — K219 Gastro-esophageal reflux disease without esophagitis: Secondary | ICD-10-CM

## 2022-02-28 DIAGNOSIS — Z860100 Personal history of colon polyps, unspecified: Secondary | ICD-10-CM

## 2022-02-28 DIAGNOSIS — Z1211 Encounter for screening for malignant neoplasm of colon: Secondary | ICD-10-CM | POA: Diagnosis not present

## 2022-02-28 DIAGNOSIS — K5730 Diverticulosis of large intestine without perforation or abscess without bleeding: Secondary | ICD-10-CM | POA: Insufficient documentation

## 2022-02-28 HISTORY — DX: Family history of other specified conditions: Z84.89

## 2022-02-28 HISTORY — PX: COLONOSCOPY WITH PROPOFOL: SHX5780

## 2022-02-28 SURGERY — COLONOSCOPY WITH PROPOFOL
Anesthesia: General

## 2022-02-28 MED ORDER — SODIUM CHLORIDE 0.9 % IV SOLN: INTRAVENOUS | Status: DC

## 2022-02-28 MED ORDER — STERILE WATER FOR IRRIGATION IR SOLN
Status: DC | PRN
  Administered 2022-02-28: 1

## 2022-02-28 MED ORDER — LACTATED RINGERS IV SOLN: INTRAVENOUS | Status: DC

## 2022-02-28 MED ORDER — LIDOCAINE HCL (CARDIAC) PF 100 MG/5ML IV SOSY
PREFILLED_SYRINGE | INTRAVENOUS | Status: DC | PRN
  Administered 2022-02-28: 80 mg via INTRAVENOUS

## 2022-02-28 MED ORDER — PROPOFOL 10 MG/ML IV BOLUS
INTRAVENOUS | Status: DC | PRN
  Administered 2022-02-28: 80 mg via INTRAVENOUS
  Administered 2022-02-28: 90 mg via INTRAVENOUS
  Administered 2022-02-28 (×2): 40 mg via INTRAVENOUS

## 2022-02-28 SURGICAL SUPPLY — 21 items
CLIP HMST 235XBRD CATH ROT (MISCELLANEOUS) IMPLANT
CLIP RESOLUTION 360 11X235 (MISCELLANEOUS)
ELECT REM PT RETURN 9FT ADLT (ELECTROSURGICAL)
ELECTRODE REM PT RTRN 9FT ADLT (ELECTROSURGICAL) IMPLANT
FORCEPS BIOP RAD 4 LRG CAP 4 (CUTTING FORCEPS) IMPLANT
GOWN CVR UNV OPN BCK APRN NK (MISCELLANEOUS) ×4 IMPLANT
GOWN ISOL THUMB LOOP REG UNIV (MISCELLANEOUS) ×2
INJECTOR VARIJECT VIN23 (MISCELLANEOUS) IMPLANT
KIT DEFENDO VALVE AND CONN (KITS) IMPLANT
KIT PRC NS LF DISP ENDO (KITS) ×2 IMPLANT
KIT PROCEDURE OLYMPUS (KITS) ×1
MANIFOLD NEPTUNE II (INSTRUMENTS) ×2 IMPLANT
MARKER SPOT ENDO TATTOO 5ML (MISCELLANEOUS) IMPLANT
PROBE APC STR FIRE (PROBE) IMPLANT
RETRIEVER NET ROTH 2.5X230 LF (MISCELLANEOUS) IMPLANT
SNARE COLD EXACTO (MISCELLANEOUS) IMPLANT
SNARE SHORT THROW 13M SML OVAL (MISCELLANEOUS) IMPLANT
SNARE SNG USE RND 15MM (INSTRUMENTS) IMPLANT
TRAP ETRAP POLY (MISCELLANEOUS) IMPLANT
VARIJECT INJECTOR VIN23 (MISCELLANEOUS)
WATER STERILE IRR 250ML POUR (IV SOLUTION) ×2 IMPLANT
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
## 2022-02-28 NOTE — Anesthesia Postprocedure Evaluation (Signed)
Anesthesia Post Note  Patient: Christopher Chen  Procedure(s) Performed: COLONOSCOPY WITH PROPOFOL  Patient location during evaluation: PACU Anesthesia Type: General Level of consciousness: awake and alert, oriented and patient cooperative Pain management: pain level controlled Vital Signs Assessment: post-procedure vital signs reviewed and stable Respiratory status: spontaneous breathing, nonlabored ventilation and respiratory function stable Cardiovascular status: blood pressure returned to baseline and stable Postop Assessment: adequate PO intake Anesthetic complications: no   No notable events documented.   Last Vitals:  Vitals:   02/28/22 0810 02/28/22 0820  BP: 106/76 119/78  Pulse: 60 64  Resp: 18 18  Temp: (!) 36.4 C (!) 36.4 C  SpO2: 95% 97%    Last Pain:  Vitals:   02/28/22 0820  TempSrc:   PainSc: 0-No pain                 Darrin Nipper

## 2022-02-28 NOTE — Transfer of Care (Signed)
Immediate Anesthesia Transfer of Care Note  Patient: Christopher Chen  Procedure(s) Performed: COLONOSCOPY WITH PROPOFOL  Patient Location: PACU  Anesthesia Type: General  Level of Consciousness: awake, alert  and patient cooperative  Airway and Oxygen Therapy: Patient Spontanous Breathing and Patient connected to supplemental oxygen  Post-op Assessment: Post-op Vital signs reviewed, Patient's Cardiovascular Status Stable, Respiratory Function Stable, Patent Airway and No signs of Nausea or vomiting  Post-op Vital Signs: Reviewed and stable  Complications: No notable events documented.

## 2022-02-28 NOTE — Op Note (Signed)
[facility] Gastroenterology Patient Name: Christopher Chen Procedure Date: 02/28/2022 7:26 AM MRN: [id number] Account #: [id number] Date of Birth: 11-Nov-1970 Admit Type: Outpatient Age: 52 Room: [facility] OR ROOM 01 Gender: Male Note Status: Finalized Instrument Name: [id number] Procedure:             Colonoscopy Indications:           High risk colon cancer surveillance: Personal history                         of colonic polyps Providers:             Lucilla Lame MD, MD Medicines:             Propofol per Anesthesia Complications:         No immediate complications. Procedure:             Pre-Anesthesia Assessment:                        - Prior to the procedure, a History and Physical was                         performed, and patient medications and allergies were                         reviewed. The patient's tolerance of previous                         anesthesia was also reviewed. The risks and benefits                         of the procedure and the sedation options and risks                         were discussed with the patient. All questions were                         answered, and informed consent was obtained. Prior                         Anticoagulants: The patient has taken no anticoagulant                         or antiplatelet agents. ASA Grade Assessment: II - A                         patient with mild systemic disease. After reviewing                         the risks and benefits, the patient was deemed in                         satisfactory condition to undergo the procedure.                        After obtaining informed consent, the colonoscope was                         passed under direct vision. Throughout the  procedure,                         the patient's blood pressure, pulse, and oxygen                         saturations were monitored continuously. The                         Colonoscope was introduced through the anus and                          advanced to the the cecum, identified by appendiceal                         orifice and ileocecal valve. The colonoscopy was                         performed without difficulty. The patient tolerated                         the procedure well. The quality of the bowel                         preparation was excellent. Findings:      The perianal and digital rectal examinations were normal.      Multiple small-mouthed diverticula were found in the sigmoid colon.      Non-bleeding internal hemorrhoids were found during retroflexion. The       hemorrhoids were Grade I (internal hemorrhoids that do not prolapse). Impression:            - Diverticulosis in the sigmoid colon.                        - Non-bleeding internal hemorrhoids.                        - No specimens collected. Recommendation:        - Discharge patient to home.                        - Resume previous diet.                        - Continue present medications.                        - Repeat colonoscopy in 7 years for surveillance. Procedure Code(s):     --- Professional ---                        [id number], Colonoscopy, flexible; diagnostic, including                         collection of specimen(s) by brushing or washing, when                         performed (separate procedure) Diagnosis Code(s):     --- Professional ---  Z86.010, Personal history of colonic polyps CPT copyright 2022 American Medical Association. All rights reserved. The codes documented in this report are preliminary and upon coder review may  be revised to meet current compliance requirements. Lucilla Lame MD, MD 02/28/2022 8:09:03 AM This report has been signed electronically. Number of Addenda: 0 Note Initiated On: 02/28/2022 7:26 AM Scope Withdrawal Time: 0 hours 8 minutes 3 seconds  Total Procedure Duration: 0 hours 13 minutes 41 seconds  Estimated Blood Loss:  Estimated blood loss: none.      [facility]

## 2022-02-28 NOTE — Anesthesia Preprocedure Evaluation (Addendum)
Anesthesia Evaluation  Patient identified by MRN, date of birth, ID band Patient awake    Reviewed: Allergy & Precautions, NPO status , Patient's Chart, lab work & pertinent test results  History of Anesthesia Complications Negative for: history of anesthetic complications  Airway Mallampati: III   Neck ROM: Full    Dental no notable dental hx.    Pulmonary sleep apnea    Pulmonary exam normal breath sounds clear to auscultation       Cardiovascular Exercise Tolerance: Good negative cardio ROS Normal cardiovascular exam Rhythm:Regular Rate:Normal     Neuro/Psych  Headaches PSYCHIATRIC DISORDERS (OCD)         GI/Hepatic ,GERD  ,,  Endo/Other  negative endocrine ROS    Renal/GU negative Renal ROS     Musculoskeletal  (+) Arthritis ,    Abdominal   Peds  Hematology negative hematology ROS (+)   Anesthesia Other Findings   Reproductive/Obstetrics                             Anesthesia Physical Anesthesia Plan  ASA: 2  Anesthesia Plan: General   Post-op Pain Management:    Induction: Intravenous  PONV Risk Score and Plan: 2 and Propofol infusion, TIVA and Treatment may vary due to age or medical condition  Airway Management Planned: Natural Airway  Additional Equipment:   Intra-op Plan:   Post-operative Plan:   Informed Consent: I have reviewed the patients History and Physical, chart, labs and discussed the procedure including the risks, benefits and alternatives for the proposed anesthesia with the patient or authorized representative who has indicated his/her understanding and acceptance.       Plan Discussed with: CRNA  Anesthesia Plan Comments: (LMA/GETA backup discussed.  Patient consented for risks of anesthesia including but not limited to:  - adverse reactions to medications - damage to eyes, teeth, lips or other oral mucosa - nerve damage due to positioning   - sore throat or hoarseness - damage to heart, brain, nerves, lungs, other parts of body or loss of life  Informed patient about role of CRNA in peri- and intra-operative care.  Patient voiced understanding.)       Anesthesia Quick Evaluation

## 2022-03-02 ENCOUNTER — Encounter: Payer: Self-pay | Admitting: Gastroenterology

## 2022-03-07 ENCOUNTER — Encounter: Payer: Self-pay | Admitting: Gastroenterology

## 2022-11-25 ENCOUNTER — Telehealth: Payer: Self-pay | Admitting: Gastroenterology

## 2022-11-25 NOTE — Telephone Encounter (Signed)
Patient called in to get his medication refill. The medication Pantoprazole Sodium 40 MG. I offered to schedule an office the patient stated that he will just wait and see what he needs to do.

## 2022-11-29 NOTE — Telephone Encounter (Signed)
The patient is schedule to see Dr. Servando Snare on 12/08/22 at 3:30 pm in [place].

## 2022-11-30 MED ORDER — PANTOPRAZOLE SODIUM 40 MG PO TBEC: 40.0000 mg | DELAYED_RELEASE_TABLET | Freq: Every day | ORAL | 0 refills | Status: DC

## 2022-11-30 NOTE — Addendum Note (Signed)
Addended by: Roena Malady on: 11/30/2022 08:41 AM   Modules accepted: Orders

## 2022-11-30 NOTE — Telephone Encounter (Signed)
Rx sent through [facility]  

## 2022-12-08 ENCOUNTER — Ambulatory Visit: Payer: BC Managed Care – PPO | Admitting: Gastroenterology

## 2022-12-08 ENCOUNTER — Encounter: Payer: Self-pay | Admitting: Gastroenterology

## 2022-12-08 VITALS — BP 122/78 | HR 67 | Temp 98.4°F | Wt 206.0 lb

## 2022-12-08 DIAGNOSIS — R194 Change in bowel habit: Secondary | ICD-10-CM

## 2022-12-08 DIAGNOSIS — R634 Abnormal weight loss: Secondary | ICD-10-CM | POA: Diagnosis not present

## 2022-12-08 DIAGNOSIS — R195 Other fecal abnormalities: Secondary | ICD-10-CM | POA: Diagnosis not present

## 2022-12-08 DIAGNOSIS — Z8719 Personal history of other diseases of the digestive system: Secondary | ICD-10-CM | POA: Diagnosis not present
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
## 2022-12-12 ENCOUNTER — Telehealth: Payer: Self-pay | Admitting: Gastroenterology

## 2022-12-12 NOTE — Telephone Encounter (Signed)
The patient called in and had questions about his lab sample and wanting to speak with someone in the [place] location or [employer]. Transfer call to [place].

## 2022-12-13 ENCOUNTER — Telehealth: Payer: Self-pay

## 2022-12-13 NOTE — Telephone Encounter (Signed)
Pt returned my call and stated that he was able to get everything straightened out with [facility] and turning in the specimen, nothing further needed

## 2022-12-13 NOTE — Telephone Encounter (Signed)
Left message on voicemail Regarding his call with Misty Stanley in [place] yesterday regarding his labs

## 2022-12-17 LAB — PANCREATIC ELASTASE, FECAL: Pancreatic Elastase, Fecal: >800 ug Elast./g (ref >200)

## 2022-12-17 LAB — FECAL FAT, QUALITATIVE
Fat Qual Neutral, Stl: INCREASED
Fat Qual Total, Stl: INCREASED

## 2022-12-20 ENCOUNTER — Encounter: Payer: Self-pay | Admitting: Gastroenterology

## 2022-12-20 MED ORDER — RIFAXIMIN 550 MG PO TABS: 550.0000 mg | ORAL_TABLET | Freq: Three times a day (TID) | ORAL | 0 refills | Status: AC

## 2022-12-20 NOTE — Addendum Note (Signed)
Addended by: Roena Malady on: 12/20/2022 11:26 AM   Modules accepted: Orders

## 2022-12-23 ENCOUNTER — Encounter: Payer: Self-pay | Admitting: Gastroenterology

## 2022-12-23 NOTE — Anesthesia Preprocedure Evaluation (Signed)
Anesthesia Evaluation  Patient identified by MRN, date of birth, ID band Patient awake    Reviewed: Allergy & Precautions, H&P , NPO status , Patient's Chart, lab work & pertinent test results  History of Anesthesia Complications (+) Family history of anesthesia reaction  Airway Mallampati: I  TM Distance: >3 FB Neck ROM: Full    Dental no notable dental hx.    Pulmonary neg pulmonary ROS, sleep apnea    Pulmonary exam normal breath sounds clear to auscultation       Cardiovascular hypertension, Normal cardiovascular exam Rhythm:Regular Rate:Normal  01-22-15 Procedure narrative: Transthoracic echocardiography. Image    quality was poor. The study was technically difficult, as a    result of poor acoustic windows and poor sound wave transmission.  - Left ventricle: The cavity size was normal. Systolic function was    normal. The estimated ejection fraction was 65%. Wall motion was    normal; there were no regional wall motion abnormalities. Doppler    parameters are consistent with abnormal left ventricular    relaxation (grade 1 diastolic dysfunction). .  - Left atrium: The atrium was mildly dilated.      Neuro/Psych  Headaches TIAnegative neurological ROS  negative psych ROS   GI/Hepatic negative GI ROS, Neg liver ROS,,,  Endo/Other  negative endocrine ROS    Renal/GU negative Renal ROS  negative genitourinary   Musculoskeletal negative musculoskeletal ROS (+) Arthritis ,    Abdominal   Peds negative pediatric ROS (+)  Hematology negative hematology ROS (+)   Anesthesia Other Findings Generalized headaches  TIA (transient ischemic attack)--was told it was a migraine Hypertension  Migraine with aura and without status migrainosus, not intractable Hypogonadism, male  Encephalomalacia on imaging study Traumatic arthropathy of right hip Perirectal abscess Abnormal brain MRI  Sleep  apnea Arthritis  Osteoarthritis of left glenohumeral joint Family history of adverse reaction to anesthesia--father had dyspnea after block for shoulder surgery, patient has had surgery without difficulty Grade I diastolic dysfunction   Reproductive/Obstetrics negative OB ROS                             Anesthesia Physical Anesthesia Plan  ASA: 3  Anesthesia Plan: General   Post-op Pain Management:    Induction: Intravenous  PONV Risk Score and Plan:   Airway Management Planned: Natural Airway and Nasal Cannula  Additional Equipment:   Intra-op Plan:   Post-operative Plan:   Informed Consent: I have reviewed the patients History and Physical, chart, labs and discussed the procedure including the risks, benefits and alternatives for the proposed anesthesia with the patient or authorized representative who has indicated his/her understanding and acceptance.     Dental Advisory Given  Plan Discussed with: Anesthesiologist, CRNA and Surgeon  Anesthesia Plan Comments: (Patient consented for risks of anesthesia including but not limited to:  - adverse reactions to medications - risk of airway placement if required - damage to eyes, teeth, lips or other oral mucosa - nerve damage due to positioning  - sore throat or hoarseness - Damage to heart, brain, nerves, lungs, other parts of body or loss of life  Patient voiced understanding and assent.)        Anesthesia Quick Evaluation

## 2022-12-27 ENCOUNTER — Ambulatory Visit: Payer: Self-pay | Admitting: Anesthesiology

## 2022-12-27 ENCOUNTER — Encounter
Admission: RE | Disposition: A | Discharge status: Home / Self Care | Payer: Self-pay | Source: Home / Self Care | Attending: Gastroenterology

## 2022-12-27 ENCOUNTER — Encounter: Payer: Self-pay | Admitting: Gastroenterology

## 2022-12-27 ENCOUNTER — Other Ambulatory Visit: Payer: Self-pay

## 2022-12-27 ENCOUNTER — Ambulatory Visit
Admission: RE | Admit: 2022-12-27 | Discharge: 2022-12-27 | Disposition: A | Discharge status: Home / Self Care | Payer: BC Managed Care – PPO | Attending: Gastroenterology | Admitting: Gastroenterology

## 2022-12-27 DIAGNOSIS — G4730 Sleep apnea, unspecified: Secondary | ICD-10-CM | POA: Diagnosis not present

## 2022-12-27 DIAGNOSIS — R634 Abnormal weight loss: Secondary | ICD-10-CM | POA: Diagnosis present

## 2022-12-27 DIAGNOSIS — Z6825 Body mass index (BMI) 25.0-25.9, adult: Secondary | ICD-10-CM | POA: Insufficient documentation

## 2022-12-27 DIAGNOSIS — K449 Diaphragmatic hernia without obstruction or gangrene: Secondary | ICD-10-CM | POA: Diagnosis not present

## 2022-12-27 DIAGNOSIS — I10 Essential (primary) hypertension: Secondary | ICD-10-CM | POA: Diagnosis not present

## 2022-12-27 HISTORY — DX: Other ill-defined heart diseases: I51.89

## 2022-12-27 HISTORY — PX: ESOPHAGOGASTRODUODENOSCOPY (EGD) WITH PROPOFOL: SHX5813

## 2022-12-27 SURGERY — ESOPHAGOGASTRODUODENOSCOPY (EGD) WITH PROPOFOL
Anesthesia: General | Site: Mouth

## 2022-12-27 MED ORDER — LIDOCAINE HCL (PF) 2 % IJ SOLN
INTRAMUSCULAR | Status: AC
Start: 2022-12-27 — End: ?
  Filled 2022-12-27: qty 5

## 2022-12-27 MED ORDER — PROPOFOL 10 MG/ML IV BOLUS
INTRAVENOUS | Status: DC | PRN
  Administered 2022-12-27: 90 mg via INTRAVENOUS
  Administered 2022-12-27: 30 mg via INTRAVENOUS

## 2022-12-27 MED ORDER — LIDOCAINE HCL (CARDIAC) PF 100 MG/5ML IV SOSY
PREFILLED_SYRINGE | INTRAVENOUS | Status: DC | PRN
  Administered 2022-12-27: 60 mg via INTRAVENOUS

## 2022-12-27 MED ORDER — LACTATED RINGERS IV SOLN: INTRAVENOUS | Status: DC

## 2022-12-27 MED ORDER — PROPOFOL 10 MG/ML IV BOLUS
INTRAVENOUS | Status: AC
  Filled 2022-12-27: qty 20

## 2022-12-27 SURGICAL SUPPLY — 32 items
BALLN DILATOR 12-15 8 (BALLOONS)
BALLN DILATOR 15-18 8 (BALLOONS)
BALLN DILATOR CRE 0-12 8 (BALLOONS)
BALLN DILATOR ESOPH 8 10 CRE (MISCELLANEOUS) IMPLANT
BALLOON DILATOR 12-15 8 (BALLOONS) IMPLANT
BALLOON DILATOR 15-18 8 (BALLOONS) IMPLANT
BALLOON DILATOR CRE 0-12 8 (BALLOONS) IMPLANT
BLOCK BITE 60FR ADLT L/F GRN (MISCELLANEOUS) ×2 IMPLANT
CLIP HMST 235XBRD CATH ROT (MISCELLANEOUS) IMPLANT
CLIP RESOLUTION 360 11X235 (MISCELLANEOUS)
ELECT REM PT RETURN 9FT ADLT (ELECTROSURGICAL)
ELECTRODE REM PT RTRN 9FT ADLT (ELECTROSURGICAL) IMPLANT
FCP ESCP3.2XJMB 240X2.8X (MISCELLANEOUS)
FORCEPS BIOP RAD 4 LRG CAP 4 (CUTTING FORCEPS) IMPLANT
FORCEPS BIOP RJ4 240 W/NDL (MISCELLANEOUS)
FORCEPS ESCP3.2XJMB 240X2.8X (MISCELLANEOUS) IMPLANT
GOWN CVR UNV OPN BCK APRN NK (MISCELLANEOUS) ×4 IMPLANT
GOWN ISOL THUMB LOOP REG UNIV (MISCELLANEOUS) ×2
INJECTOR VARIJECT VIN23 (MISCELLANEOUS) IMPLANT
KIT DEFENDO VALVE AND CONN (KITS) IMPLANT
KIT PRC NS LF DISP ENDO (KITS) ×2 IMPLANT
KIT PROCEDURE OLYMPUS (KITS) ×1
MANIFOLD NEPTUNE II (INSTRUMENTS) ×2 IMPLANT
MARKER SPOT ENDO TATTOO 5ML (MISCELLANEOUS) IMPLANT
RETRIEVER NET PLAT FOOD (MISCELLANEOUS) IMPLANT
SNARE SHORT THROW 13M SML OVAL (MISCELLANEOUS) IMPLANT
SNARE SHORT THROW 30M LRG OVAL (MISCELLANEOUS) IMPLANT
SYR INFLATION 60ML (SYRINGE) IMPLANT
TRAP ETRAP POLY (MISCELLANEOUS) IMPLANT
VARIJECT INJECTOR VIN23 (MISCELLANEOUS)
WATER STERILE IRR 250ML POUR (IV SOLUTION) ×2 IMPLANT
WIRE CRE 18-20MM 8CM F G (MISCELLANEOUS) IMPLANT

## 2022-12-27 NOTE — Op Note (Signed)
[facility] Gastroenterology Patient Name: Christopher Chen Procedure Date: 12/27/2022 7:54 AM MRN: [id number] Account #: [id number] Date of Birth: [birth year] Admit Type: Outpatient Age: 52 Room: [facility] OR ROOM 01 Gender: Male Note Status: Finalized Instrument Name: [id number] Procedure:             Upper GI endoscopy Indications:           Weight loss Providers:             Midge Minium MD, MD Referring MD:          Marina Goodell (Referring MD) Medicines:             Propofol per Anesthesia Complications:         No immediate complications. Procedure:             Pre-Anesthesia Assessment:                        - Prior to the procedure, a History and Physical was                         performed, and patient medications and allergies were                         reviewed. The patient's tolerance of previous                         anesthesia was also reviewed. The risks and benefits                         of the procedure and the sedation options and risks                         were discussed with the patient. All questions were                         answered, and informed consent was obtained. Prior                         Anticoagulants: The patient has taken no anticoagulant                         or antiplatelet agents. ASA Grade Assessment: II - A                         patient with mild systemic disease. After reviewing                         the risks and benefits, the patient was deemed in                         satisfactory condition to undergo the procedure.                        After obtaining informed consent, the endoscope was                         passed under direct vision. Throughout the procedure,  the patient's blood pressure, pulse, and oxygen                         saturations were monitored continuously. The was                         introduced through the mouth, and advanced to the                          second part of duodenum. The upper GI endoscopy was                         accomplished without difficulty. The patient tolerated                         the procedure well. Findings:      A 5 cm hiatal hernia was present.      The stomach was normal.      The examined duodenum was normal. Biopsies were taken with a cold       forceps for histology. Impression:            - 5 cm hiatal hernia.                        - Normal stomach.                        - Normal examined duodenum. Biopsied. Recommendation:        - Discharge patient to home.                        - Resume previous diet.                        - Continue present medications.                        - Await pathology results. Procedure Code(s):     --- Professional ---                        [phone], Esophagogastroduodenoscopy, flexible,                         transoral; with biopsy, single or multiple Diagnosis Code(s):     --- Professional ---                        R63.4, Abnormal weight loss CPT copyright 2022 American Medical Association. All rights reserved. The codes documented in this report are preliminary and upon coder review may  be revised to meet current compliance requirements. Midge Minium MD, MD 12/27/2022 8:07:46 AM This report has been signed electronically. Number of Addenda: 0 Note Initiated On: 12/27/2022 7:54 AM Total Procedure Duration: 0 hours 2 minutes 55 seconds  Estimated Blood Loss:  Estimated blood loss: none.      [facility]

## 2022-12-27 NOTE — Transfer of Care (Signed)
Immediate Anesthesia Transfer of Care Note  Patient: Christopher Chen  Procedure(s) Performed: ESOPHAGOGASTRODUODENOSCOPY (EGD) WITH PROPOFOL (Mouth)  Patient Location: PACU  Anesthesia Type: General  Level of Consciousness: awake, alert  and patient cooperative  Airway and Oxygen Therapy: Patient Spontanous Breathing and Patient connected to supplemental oxygen  Post-op Assessment: Post-op Vital signs reviewed, Patient's Cardiovascular Status Stable, Respiratory Function Stable, Patent Airway and No signs of Nausea or vomiting  Post-op Vital Signs: Reviewed and stable  Complications: No notable events documented.
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
## 2022-12-27 NOTE — Anesthesia Postprocedure Evaluation (Signed)
Anesthesia Post Note  Patient: [name]  Procedure(s) Performed: ESOPHAGOGASTRODUODENOSCOPY (EGD) WITH PROPOFOL (Mouth)  Patient location during evaluation: PACU Anesthesia Type: General Level of consciousness: awake and alert Pain management: pain level controlled Vital Signs Assessment: post-procedure vital signs reviewed and stable Respiratory status: spontaneous breathing, nonlabored ventilation, respiratory function stable and patient connected to nasal cannula oxygen Cardiovascular status: blood pressure returned to baseline and stable Postop Assessment: no apparent nausea or vomiting Anesthetic complications: no   No notable events documented.   Last Vitals:  Vitals:   12/27/22 0816 12/27/22 0826  BP: 115/81 113/76  Pulse: 67 67  Resp: 15 11  Temp:    SpO2: 96% 94%    Last Pain:  Vitals:   12/27/22 0811  TempSrc:   PainSc: 0-No pain                 Marisue Humble

## 2022-12-28 ENCOUNTER — Encounter: Payer: Self-pay | Admitting: Gastroenterology

## 2022-12-30 LAB — SURGICAL PATHOLOGY

## 2022-12-31 ENCOUNTER — Encounter: Payer: Self-pay | Admitting: Gastroenterology

## 2023-03-03 ENCOUNTER — Other Ambulatory Visit: Payer: Self-pay | Admitting: Gastroenterology

## 2023-09-02 ENCOUNTER — Other Ambulatory Visit: Payer: Self-pay | Admitting: Gastroenterology

## 2023-12-03 ENCOUNTER — Other Ambulatory Visit: Payer: Self-pay | Admitting: Gastroenterology

## 2024-02-28 ENCOUNTER — Other Ambulatory Visit: Payer: Self-pay | Admitting: Gastroenterology
